# Patient Record
Sex: Female | Born: 1960 | Race: White | Hispanic: No | Marital: Married | Smoking: Never smoker
Health system: Southern US, Community
[De-identification: ages and names within clinical notes are randomized; demographics above are authoritative.]

## PROBLEM LIST (undated history)

## (undated) DIAGNOSIS — C539 Malignant neoplasm of cervix uteri, unspecified: Secondary | ICD-10-CM

## (undated) DIAGNOSIS — I10 Essential (primary) hypertension: Secondary | ICD-10-CM

## (undated) DIAGNOSIS — K602 Anal fissure, unspecified: Secondary | ICD-10-CM

## (undated) DIAGNOSIS — E079 Disorder of thyroid, unspecified: Secondary | ICD-10-CM

## (undated) DIAGNOSIS — D649 Anemia, unspecified: Secondary | ICD-10-CM

## (undated) DIAGNOSIS — C801 Malignant (primary) neoplasm, unspecified: Secondary | ICD-10-CM

## (undated) DIAGNOSIS — C4491 Basal cell carcinoma of skin, unspecified: Secondary | ICD-10-CM

## (undated) DIAGNOSIS — E039 Hypothyroidism, unspecified: Secondary | ICD-10-CM

## (undated) HISTORY — DX: Anal fissure, unspecified: K60.2

## (undated) HISTORY — DX: Anemia, unspecified: D64.9

## (undated) HISTORY — PX: GANGLION CYST EXCISION: SHX1691

## (undated) HISTORY — DX: Essential (primary) hypertension: I10

## (undated) HISTORY — PX: COLONOSCOPY: SHX174

## (undated) HISTORY — DX: Basal cell carcinoma of skin, unspecified: C44.91

## (undated) HISTORY — DX: Malignant neoplasm of cervix uteri, unspecified: C53.9

## (undated) HISTORY — DX: Disorder of thyroid, unspecified: E07.9

## (undated) HISTORY — DX: Hypothyroidism, unspecified: E03.9

## (undated) HISTORY — DX: Malignant (primary) neoplasm, unspecified: C80.1

---

## 1997-11-21 ENCOUNTER — Emergency Department (HOSPITAL_COMMUNITY): Admission: EM | Admit: 1997-11-21 | Discharge: 1997-11-21 | Payer: Self-pay | Admitting: Emergency Medicine

## 1998-04-04 ENCOUNTER — Other Ambulatory Visit: Admission: RE | Admit: 1998-04-04 | Discharge: 1998-04-04 | Payer: Self-pay | Admitting: Obstetrics and Gynecology

## 1999-05-05 ENCOUNTER — Other Ambulatory Visit: Admission: RE | Admit: 1999-05-05 | Discharge: 1999-05-05 | Payer: Self-pay | Admitting: Obstetrics and Gynecology

## 2000-05-20 ENCOUNTER — Other Ambulatory Visit: Admission: RE | Admit: 2000-05-20 | Discharge: 2000-05-20 | Payer: Self-pay | Admitting: Obstetrics and Gynecology

## 2001-09-17 ENCOUNTER — Other Ambulatory Visit: Admission: RE | Admit: 2001-09-17 | Discharge: 2001-09-17 | Payer: Self-pay | Admitting: Obstetrics and Gynecology

## 2002-10-22 ENCOUNTER — Other Ambulatory Visit: Admission: RE | Admit: 2002-10-22 | Discharge: 2002-10-22 | Payer: Self-pay | Admitting: Obstetrics and Gynecology

## 2004-02-18 ENCOUNTER — Other Ambulatory Visit: Admission: RE | Admit: 2004-02-18 | Discharge: 2004-02-18 | Payer: Self-pay | Admitting: Obstetrics and Gynecology

## 2005-04-10 ENCOUNTER — Other Ambulatory Visit: Admission: RE | Admit: 2005-04-10 | Discharge: 2005-04-10 | Payer: Self-pay | Admitting: Obstetrics and Gynecology

## 2005-04-30 ENCOUNTER — Ambulatory Visit (HOSPITAL_COMMUNITY): Admission: RE | Admit: 2005-04-30 | Discharge: 2005-04-30 | Payer: Self-pay | Admitting: Neurology

## 2008-10-19 ENCOUNTER — Ambulatory Visit (HOSPITAL_BASED_OUTPATIENT_CLINIC_OR_DEPARTMENT_OTHER): Admission: RE | Admit: 2008-10-19 | Discharge: 2008-10-19 | Payer: Self-pay | Admitting: Orthopedic Surgery

## 2009-06-16 ENCOUNTER — Ambulatory Visit: Payer: Self-pay | Admitting: Sports Medicine

## 2009-06-16 DIAGNOSIS — M25569 Pain in unspecified knee: Secondary | ICD-10-CM | POA: Insufficient documentation

## 2009-07-26 ENCOUNTER — Ambulatory Visit: Payer: Self-pay | Admitting: Sports Medicine

## 2009-07-26 DIAGNOSIS — M62838 Other muscle spasm: Secondary | ICD-10-CM | POA: Insufficient documentation

## 2010-01-05 ENCOUNTER — Encounter: Admission: RE | Admit: 2010-01-05 | Discharge: 2010-01-05 | Payer: Self-pay | Admitting: Obstetrics and Gynecology

## 2010-09-03 ENCOUNTER — Encounter: Payer: Self-pay | Admitting: Obstetrics and Gynecology

## 2010-11-23 LAB — POCT HEMOGLOBIN-HEMACUE: Hemoglobin: 13 g/dL (ref 12.0–15.0)

## 2010-12-26 NOTE — Op Note (Signed)
Holly, Love             ACCOUNT NO.:  [id number]   MEDICAL RECORD NO.:  [id number]          PATIENT TYPE:  AMB   LOCATION:  DSC                          FACILITY:  [facility]   PHYSICIAN:  Katy Fitch. Sypher, M.D. DATE OF BIRTH:  1960/11/29   DATE OF PROCEDURE:  10/19/2008  DATE OF DISCHARGE:                               OPERATIVE REPORT   PREOPERATIVE DIAGNOSIS:  Painful right dorsal ganglion.   POSTOPERATIVE DIAGNOSIS:  Painful right dorsal ganglion.   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 ganglion, with wrist arthrotomy and debridement of dorsal  aspect of scapholunate ligament.   OPERATING SURGEON:  Katy Fitch. Sypher, MD   ASSISTANT:  No assistant.   ANESTHESIA:  General by LMA.   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  I had  seen her several years prior and had diagnosed the dorsal ganglion.  She  elected to live with her situation as she knew was a benign process.  She returned in February of 2010 and reported increasing pain with wrist  motion and load bearing on the wrist and requested excision.   Once again, we reviewed the pathophysiology of ganglions.  She  understands that these are myxomatous degenerative lesions that are  poorly understood.  The cyst / mass could grow off the dorsal aspect of  the scapholunate ligament and present in the region of the second and  fourth dorsal compartment.  They can be mechanically debrided, but we  cannot guarantee that she will have resolution of this process with a  mechanical debridement of what is a biological misbehavior.   She was informed of the current art of removal.  She was brought to the  operating room at this time anticipating cyst debridement, arthrotomy,  scapholunate ligament debridement, and wound repair.   Questions were invited and answered in detail.   PROCEDURE:  Holly Love is brought to the operating room and placed  in a supine position on the operating table.   Following the induction of general anesthesia by LMA technique, the  right arm was prepped with Betadine soap and solution and sterilely  draped.  Following a time-out procedure and noting her PENICILLIN  allergy, her right arm was exsanguinated with an Esmarch bandage and an  arterial tourniquet inflated to 250 mmHg.  The procedure commenced with  a 2-cm transverse incision directly over the mass.  Subcutaneous tissues  were carefully divided.  The cyst was dissected past the extensor  retinaculum between the second and fourth dorsal compartments, around  the dorsal carpal arch and to the dorsal wrist capsular ligaments.  In  the line of the ligaments, an oblique incision was fashioned and the  neck of the cyst followed to the scapholunate ligament.  The joint was  explored.  A limited synovectomy was accomplished.  The scapholunate  ligament was yellowish and degenerative in appearance.  This was  debrided superficially with a micro rongeur.  Bleeding points were electrocauterized on the capsule.  The wound was  then inspected for other issues.  The capsule was then left open,  followed by wound closure with intradermal 3-0 Prolene.  A gauze pledget  was placed directly over the wound to close dead space followed by  sterile dressing with sterile gauze, sterile Webril, and an Ace wrap.  There were no apparent complications.   [name] may begin immediate wrist range of motion.  I will see her  back in followup in 1 week.  For aftercare, she is provided a  prescription for Vicodin 5 mg 1 or 2 tablets p.o. q.4-6 h. p.r.n. pain,  20 tablets without refill.      Katy Fitch Sypher, M.D.  Electronically Signed     RVS/MEDQ  D:  10/19/2008  T:  10/20/2008  Job:  [id number]   cc:   Marlou Starks

## 2012-07-17 ENCOUNTER — Ambulatory Visit: Payer: Self-pay | Admitting: Internal Medicine

## 2012-07-17 ENCOUNTER — Ambulatory Visit (INDEPENDENT_AMBULATORY_CARE_PROVIDER_SITE_OTHER): Payer: Managed Care, Other (non HMO) | Admitting: Internal Medicine

## 2012-07-17 ENCOUNTER — Encounter: Payer: Self-pay | Admitting: Internal Medicine

## 2012-07-17 VITALS — BP 150/88 | HR 73 | Temp 97.0°F | Resp 18 | Wt 157.0 lb

## 2012-07-17 DIAGNOSIS — R030 Elevated blood-pressure reading, without diagnosis of hypertension: Secondary | ICD-10-CM

## 2012-07-17 DIAGNOSIS — IMO0001 Reserved for inherently not codable concepts without codable children: Secondary | ICD-10-CM

## 2012-07-17 DIAGNOSIS — Z780 Asymptomatic menopausal state: Secondary | ICD-10-CM

## 2012-07-17 DIAGNOSIS — E039 Hypothyroidism, unspecified: Secondary | ICD-10-CM

## 2012-07-17 DIAGNOSIS — I10 Essential (primary) hypertension: Secondary | ICD-10-CM

## 2012-07-17 DIAGNOSIS — M8580 Other specified disorders of bone density and structure, unspecified site: Secondary | ICD-10-CM

## 2012-07-17 DIAGNOSIS — F419 Anxiety disorder, unspecified: Secondary | ICD-10-CM

## 2012-07-17 DIAGNOSIS — N951 Menopausal and female climacteric states: Secondary | ICD-10-CM

## 2012-07-17 DIAGNOSIS — M1990 Unspecified osteoarthritis, unspecified site: Secondary | ICD-10-CM

## 2012-07-17 DIAGNOSIS — M899 Disorder of bone, unspecified: Secondary | ICD-10-CM

## 2012-07-17 DIAGNOSIS — F411 Generalized anxiety disorder: Secondary | ICD-10-CM

## 2012-07-17 LAB — COMPREHENSIVE METABOLIC PANEL
ALT: 21 U/L (ref 0–35)
Alkaline Phosphatase: 64 U/L (ref 39–117)
CO2: 31 mEq/L (ref 19–32)
Chloride: 105 mEq/L (ref 96–112)
Creat: 0.84 mg/dL (ref 0.50–1.10)
Total Bilirubin: 0.4 mg/dL (ref 0.3–1.2)

## 2012-07-17 LAB — LIPID PANEL
HDL: 49 mg/dL (ref >39)
LDL Cholesterol: 144 mg/dL — ABNORMAL HIGH (ref 0–99)
Total CHOL/HDL Ratio: 4.7 Ratio
Triglycerides: 182 mg/dL — ABNORMAL HIGH (ref <150)

## 2012-07-17 LAB — CBC WITH DIFFERENTIAL/PLATELET
Hemoglobin: 12.6 g/dL (ref 12.0–15.0)
Monocytes Absolute: 0.5 10*3/uL (ref 0.1–1.0)
Monocytes Relative: 10 % (ref 3–12)
Neutrophils Relative %: 41 % — ABNORMAL LOW (ref 43–77)
Platelets: 260 10*3/uL (ref 150–400)
WBC: 5.2 10*3/uL (ref 4.0–10.5)

## 2012-07-17 NOTE — Progress Notes (Signed)
Subjective:    Patient ID: Holly Love, female    DOB: 01/21/1961, 51 y.o.   MRN: [id number]  HPI New pt here for first visit.    Former care Dr. Leane Platt and also sees [facility] Dr. Alessandra Bevels PMH of Hypothyroidism on Armour thryoid, anxiety, DJD of knees, perimenopause, migraine headaches and recent osteopenia of hips  Evey is concerned about taking armour thyroid and recent diagnosis of osteopenia.   She reports she was diagnosed by blood test and Dr. Alessandra Bevels placed her on armour thyroid.  At some point she was told to increase to 60 mg but pt began having heart palpitations so she went back to her current 30 mg dose.  She was told that the Armour thyroid would also help with memory issues.    Migraines controlled with Nsaids  Strong FH of HTN  both GP had CVA.  See BP  Pt asymptomatic no chest pain or pressure, no headache or dizziness.    Allergies  Allergen Reactions  . Penicillins Hypertension   History reviewed. No pertinent past medical history. Past Surgical History  Procedure Date  . Cesarean section    History   Social History  . Marital Status: Married    Spouse Name: N/A    Number of Children: N/A  . Years of Education: N/A   Occupational History  . Not on file.   Social History Main Topics  . Smoking status: Light Tobacco Smoker    Types: Cigarettes  . Smokeless tobacco: Never Used  . Alcohol Use:   . Drug Use: No  . Sexually Active: Yes   Other Topics Concern  . Not on file   Social History Narrative  . No narrative on file   Family History  Problem Relation Age of Onset  . Cancer Mother 35    breast  . Kidney disease Mother   . Hyperlipidemia Father    Patient Active Problem List  Diagnosis  . KNEE PAIN, BILATERAL  . MUSCLE SPASM, TRAPEZIUS   Current Outpatient Prescriptions on File Prior to Visit  Medication Sig Dispense Refill  . Calcium-Vitamin D-Vitamin K (CALCIUM + D + K PO) Take 1 tablet by mouth daily.       . progesterone (PROMETRIUM) 100 MG capsule Take 60 mg by mouth daily.      Marland Kitchen thyroid (ARMOUR) 30 MG tablet Take 30 mg by mouth daily.          Review of Systems See HPI    Objective:   Physical Exam Physical Exam  Nursing note and vitals reviewed.  Constitutional: She is oriented to person, place, and time. She appears well-developed and well-nourished.  HENT:  Head: Normocephalic and atraumatic.  Cardiovascular: Normal rate and regular rhythm. Exam reveals no gallop and no friction rub.  No murmur heard.  Pulmonary/Chest: Breath sounds normal. She has no wheezes. She has no rales.  Neurological: She is alert and oriented to person, place, and time.  Skin: Skin is warm and dry.  Psychiatric: She has a normal mood and affect. Her behavior is normal.  Ext no edema            Assessment & Plan:  Elevated BP:  Repeat  BP my exam still elevated.  Will recheck in 1-2 weeks.  Pt to moniter as an outpatient.  Will take off Armour thyroid for now and recheck thyroid levels when medication cleared . EKG today  SR no acute changes.  Question of hypothyroidism  Will need old records pt to sign.  Due to elevation of BP will stop Armour thyroid for now.  Check TSH today  DJD of knees  History of migraine  Continue NSAID  Concern for memory  Will readdress at next visit.    See me in 1-2 weeks or sooner prn

## 2012-07-20 ENCOUNTER — Encounter: Payer: Self-pay | Admitting: Internal Medicine

## 2012-07-20 DIAGNOSIS — N951 Menopausal and female climacteric states: Secondary | ICD-10-CM | POA: Insufficient documentation

## 2012-07-20 DIAGNOSIS — M8580 Other specified disorders of bone density and structure, unspecified site: Secondary | ICD-10-CM | POA: Insufficient documentation

## 2012-07-20 DIAGNOSIS — M159 Polyosteoarthritis, unspecified: Secondary | ICD-10-CM | POA: Insufficient documentation

## 2012-07-20 DIAGNOSIS — IMO0001 Reserved for inherently not codable concepts without codable children: Secondary | ICD-10-CM | POA: Insufficient documentation

## 2012-07-20 DIAGNOSIS — F411 Generalized anxiety disorder: Secondary | ICD-10-CM | POA: Insufficient documentation

## 2012-07-20 DIAGNOSIS — E039 Hypothyroidism, unspecified: Secondary | ICD-10-CM | POA: Insufficient documentation

## 2012-07-20 DIAGNOSIS — F419 Anxiety disorder, unspecified: Secondary | ICD-10-CM | POA: Insufficient documentation

## 2012-07-20 DIAGNOSIS — M1990 Unspecified osteoarthritis, unspecified site: Secondary | ICD-10-CM | POA: Insufficient documentation

## 2012-07-21 ENCOUNTER — Telehealth: Payer: Self-pay | Admitting: *Deleted

## 2012-07-21 ENCOUNTER — Encounter: Payer: Self-pay | Admitting: *Deleted

## 2012-07-21 NOTE — Telephone Encounter (Signed)
Lab results mailed to pt home address

## 2012-07-21 NOTE — Telephone Encounter (Signed)
Message copied by Mathews Robinsons on Mon Jul 21, 2012  1:49 PM ------      Message from: Raechel Chute D      Created: Sun Jul 20, 2012  3:39 PM       Ok to mail labs to pt.  Write on letter to keep appt with me

## 2012-07-31 ENCOUNTER — Ambulatory Visit (INDEPENDENT_AMBULATORY_CARE_PROVIDER_SITE_OTHER): Payer: Managed Care, Other (non HMO) | Admitting: Internal Medicine

## 2012-07-31 ENCOUNTER — Encounter: Payer: Self-pay | Admitting: Internal Medicine

## 2012-07-31 VITALS — BP 114/80 | HR 81 | Temp 97.3°F | Resp 16 | Wt 158.0 lb

## 2012-07-31 DIAGNOSIS — IMO0001 Reserved for inherently not codable concepts without codable children: Secondary | ICD-10-CM

## 2012-07-31 DIAGNOSIS — E785 Hyperlipidemia, unspecified: Secondary | ICD-10-CM

## 2012-07-31 DIAGNOSIS — M899 Disorder of bone, unspecified: Secondary | ICD-10-CM

## 2012-07-31 DIAGNOSIS — N951 Menopausal and female climacteric states: Secondary | ICD-10-CM

## 2012-07-31 DIAGNOSIS — F411 Generalized anxiety disorder: Secondary | ICD-10-CM

## 2012-07-31 DIAGNOSIS — M8580 Other specified disorders of bone density and structure, unspecified site: Secondary | ICD-10-CM

## 2012-07-31 DIAGNOSIS — F419 Anxiety disorder, unspecified: Secondary | ICD-10-CM

## 2012-07-31 DIAGNOSIS — R030 Elevated blood-pressure reading, without diagnosis of hypertension: Secondary | ICD-10-CM

## 2012-07-31 NOTE — Patient Instructions (Signed)
Keep next appt with me  [facility] for fish oil 2 gms daily

## 2012-07-31 NOTE — Progress Notes (Signed)
  Subjective:    Patient ID: Holly Love, female    DOB: Apr 12, 1961, 51 y.o.   MRN: [id number]  HPI Kandi is here after stopping her armour thyroid.  She has been checking her BP and SBP ranged 118-136 as outpt.  She states she feels less anxious  "calmer"  Allergies  Allergen Reactions  . Penicillins Hypertension   History reviewed. No pertinent past medical history. Past Surgical History  Procedure Date  . Cesarean section    History   Social History  . Marital Status: Married    Spouse Name: N/A    Number of Children: N/A  . Years of Education: N/A   Occupational History  . Not on file.   Social History Main Topics  . Smoking status: Light Tobacco Smoker    Types: Cigarettes  . Smokeless tobacco: Never Used  . Alcohol Use:   . Drug Use: No  . Sexually Active: Yes   Other Topics Concern  . Not on file   Social History Narrative  . No narrative on file   Family History  Problem Relation Age of Onset  . Cancer Mother 79    breast  . Kidney disease Mother   . Hyperlipidemia Father    Patient Active Problem List  Diagnosis  . KNEE PAIN, BILATERAL  . MUSCLE SPASM, TRAPEZIUS  . Elevated blood pressure  . Anxiety  . DJD (degenerative joint disease)  . Perimenopause  . Osteopenia  . Hypothyroidism   Current Outpatient Prescriptions on File Prior to Visit  Medication Sig Dispense Refill  . Calcium-Vitamin D-Vitamin K (CALCIUM + D + K PO) Take 2 tablets by mouth daily.       . cholecalciferol (VITAMIN D) 1000 UNITS tablet Take 1,000 Units by mouth daily.      . fish oil-omega-3 fatty acids 1000 MG capsule Take 2 g by mouth daily.      . magnesium 30 MG tablet Take 30 mg by mouth 2 (two) times daily.      . magnesium oxide (MAG-OX) 400 MG tablet Take 200 mg by mouth daily.       . NON FORMULARY 1 tablet daily. 5-HTP-CR hydroxytrophan      . OVER THE COUNTER MEDICATION 1 tablet daily. Vit A      . progesterone (PROMETRIUM) 100 MG capsule Take 60 mg by  mouth daily.           Review of Systems See HPI    Objective:   Physical Exam Physical Exam  Nursing note and vitals reviewed.  Constitutional: She is oriented to person, place, and time. She appears well-developed and well-nourished.  HENT:  Head: Normocephalic and atraumatic.  Cardiovascular: Normal rate and regular rhythm. Exam reveals no gallop and no friction rub.  No murmur heard.  Pulmonary/Chest: Breath sounds normal. She has no wheezes. She has no rales.  Neurological: She is alert and oriented to person, place, and time.  Skin: Skin is warm and dry.  Psychiatric: She has a normal mood and affect. Her behavior is normal.             Assessment & Plan:  Elevated BP  Normal now  Question of hypothyroid  Will check TSH and free levels at next appt  Hyperlipidemia  Levels were not fasting.  OK for fish oil and will recheck at next visit when fasting

## 2012-08-26 ENCOUNTER — Ambulatory Visit (INDEPENDENT_AMBULATORY_CARE_PROVIDER_SITE_OTHER): Payer: Managed Care, Other (non HMO) | Admitting: Internal Medicine

## 2012-08-26 ENCOUNTER — Encounter: Payer: Self-pay | Admitting: Internal Medicine

## 2012-08-26 VITALS — BP 109/66 | HR 69 | Temp 97.6°F | Resp 20 | Wt 157.0 lb

## 2012-08-26 DIAGNOSIS — K5289 Other specified noninfective gastroenteritis and colitis: Secondary | ICD-10-CM

## 2012-08-26 DIAGNOSIS — K529 Noninfective gastroenteritis and colitis, unspecified: Secondary | ICD-10-CM

## 2012-08-26 LAB — COMPREHENSIVE METABOLIC PANEL
ALT: 27 U/L (ref 0–35)
AST: 24 U/L (ref 0–37)
Alkaline Phosphatase: 67 U/L (ref 39–117)
BUN: 10 mg/dL (ref 6–23)
CO2: 27 mEq/L (ref 19–32)
Chloride: 106 mEq/L (ref 96–112)
Glucose, Bld: 78 mg/dL (ref 70–99)
Potassium: 4.1 mEq/L (ref 3.5–5.3)
Total Bilirubin: 0.4 mg/dL (ref 0.3–1.2)

## 2012-08-26 LAB — CBC WITH DIFFERENTIAL/PLATELET
Basophils Relative: 1 % (ref 0–1)
Eosinophils Relative: 3 % (ref 0–5)
Lymphocytes Relative: 32 % (ref 12–46)
Lymphs Abs: 1.5 10*3/uL (ref 0.7–4.0)
MCH: 31.3 pg (ref 26.0–34.0)
MCV: 89.8 fL (ref 78.0–100.0)
Neutro Abs: 2.5 10*3/uL (ref 1.7–7.7)
WBC: 4.5 10*3/uL (ref 4.0–10.5)

## 2012-08-26 MED ORDER — PROMETHAZINE HCL 25 MG PO TABS: 25.0000 mg | ORAL_TABLET | Freq: Three times a day (TID) | ORAL | Status: DC | PRN

## 2012-08-26 NOTE — Progress Notes (Signed)
  Subjective:    Patient ID: Holly Love, female    DOB: 10-Oct-1960, 52 y.o.   MRN: [id number]  HPI [name] is here for acute visit.  Allergies  Allergen Reactions  . Penicillins Hypertension   History reviewed. No pertinent past medical history. Past Surgical History  Procedure Date  . Cesarean section    History   Social History  . Marital Status: Married    Spouse Name: N/A    Number of Children: N/A  . Years of Education: N/A   Occupational History  . Not on file.   Social History Main Topics  . Smoking status: Light Tobacco Smoker    Types: Cigarettes  . Smokeless tobacco: Never Used  . Alcohol Use:   . Drug Use: No  . Sexually Active: Yes   Other Topics Concern  . Not on file   Social History Narrative  . No narrative on file   Family History  Problem Relation Age of Onset  . Cancer Mother 22    breast  . Kidney disease Mother   . Hyperlipidemia Father    Patient Active Problem List  Diagnosis  . KNEE PAIN, BILATERAL  . MUSCLE SPASM, TRAPEZIUS  . Elevated blood pressure  . Anxiety  . DJD (degenerative joint disease)  . Perimenopause  . Osteopenia  . Hypothyroidism  . Hyperlipidemia   Current Outpatient Prescriptions on File Prior to Visit  Medication Sig Dispense Refill  . Calcium-Vitamin D-Vitamin K (CALCIUM + D + K PO) Take 2 tablets by mouth daily.       . cholecalciferol (VITAMIN D) 1000 UNITS tablet Take 1,000 Units by mouth daily.      . fish oil-omega-3 fatty acids 1000 MG capsule Take 2 g by mouth daily.      . magnesium 30 MG tablet Take 30 mg by mouth 2 (two) times daily.      . NON FORMULARY 1 tablet daily. 5-HTP-CR hydroxytrophan      . OVER THE COUNTER MEDICATION 1 tablet daily. Vit A      . pyridOXINE (B-6) 50 MG tablet Take 50 mg by mouth daily.      . magnesium oxide (MAG-OX) 400 MG tablet Take 200 mg by mouth daily.       . progesterone (PROMETRIUM) 100 MG capsule Take 60 mg by mouth daily.           Review of  Systems see HPI     Objective:   Physical Exam Physical Exam  Nursing note and vitals reviewed.  Constitutional: She is oriented to person, place, and time. She appears well-developed and well-nourished.  HENT:  Head: Normocephalic and atraumatic.  Cardiovascular: Normal rate and regular rhythm. Exam reveals no gallop and no friction rub.  No murmur heard.  Pulmonary/Chest: Breath sounds normal. She has no wheezes. She has no rales. ABD:  Bowel sounds hyperactive.  NT?ND  No HSM.  No peritoneal signs.  No referred rbound  Neurological: She is alert and oriented to person, place, and time.  Skin: Skin is warm and dry.  Psychiatric: She has a normal mood and affect. Her behavior is normal.               Assessment & Plan:  Gastroenteritis  Likely viral  Advised bland diet  No dairy for 2 weeks.  Immodium for diarrhea  Nausea  Ok for Phenergan 25 mg p0  See me if no improvement

## 2012-08-26 NOTE — Patient Instructions (Addendum)
Over the counter Immodium  (chewable)

## 2012-08-28 ENCOUNTER — Encounter: Payer: Self-pay | Admitting: *Deleted

## 2012-09-01 ENCOUNTER — Telehealth: Payer: Self-pay | Admitting: *Deleted

## 2012-09-01 NOTE — Telephone Encounter (Signed)
Labs mailed to pt home address.

## 2012-09-18 ENCOUNTER — Encounter: Payer: Self-pay | Admitting: Internal Medicine

## 2012-09-18 ENCOUNTER — Ambulatory Visit (INDEPENDENT_AMBULATORY_CARE_PROVIDER_SITE_OTHER): Payer: Managed Care, Other (non HMO) | Admitting: Internal Medicine

## 2012-09-18 VITALS — BP 118/70 | HR 69 | Temp 97.7°F | Resp 16 | Ht 64.0 in | Wt 159.0 lb

## 2012-09-18 DIAGNOSIS — Z139 Encounter for screening, unspecified: Secondary | ICD-10-CM

## 2012-09-18 DIAGNOSIS — E039 Hypothyroidism, unspecified: Secondary | ICD-10-CM

## 2012-09-18 DIAGNOSIS — E785 Hyperlipidemia, unspecified: Secondary | ICD-10-CM

## 2012-09-18 LAB — T4, FREE: Free T4: 0.99 ng/dL (ref 0.80–1.80)

## 2012-09-18 LAB — TSH: TSH: 4.576 u[IU]/mL — ABNORMAL HIGH (ref 0.350–4.500)

## 2012-09-18 NOTE — Progress Notes (Signed)
Subjective:    Patient ID: Holly Love, female    DOB: [birth year], 52 y.o.   MRN: [id number]  HPI Alajia is here for follow up.  She wanted to come off her Armour Thyroid that was prescribed by Dr. Alessandra Bevels.  BP has been great.  She feels good .  She has noted she has gained 4 lbs at home  She has only gained 2 lbs by our scales  See lipid she has been watching low fat diet  Allergies  Allergen Reactions  . Penicillins Hypertension   No past medical history on file. Past Surgical History  Procedure Date  . Cesarean section    History   Social History  . Marital Status: Married    Spouse Name: N/A    Number of Children: N/A  . Years of Education: N/A   Occupational History  . Not on file.   Social History Main Topics  . Smoking status: Light Tobacco Smoker    Types: Cigarettes  . Smokeless tobacco: Never Used  . Alcohol Use:   . Drug Use: No  . Sexually Active: Yes   Other Topics Concern  . Not on file   Social History Narrative  . No narrative on file   Family History  Problem Relation Age of Onset  . Cancer Mother 50    breast  . Kidney disease Mother   . Hyperlipidemia Father    Patient Active Problem List  Diagnosis  . KNEE PAIN, BILATERAL  . MUSCLE SPASM, TRAPEZIUS  . Elevated blood pressure  . Anxiety  . DJD (degenerative joint disease)  . Perimenopause  . Osteopenia  . Hypothyroidism  . Hyperlipidemia  . Gastroenteritis   Current Outpatient Prescriptions on File Prior to Visit  Medication Sig Dispense Refill  . Calcium-Vitamin D-Vitamin K (CALCIUM + D + K PO) Take 2 tablets by mouth daily.       . cholecalciferol (VITAMIN D) 1000 UNITS tablet Take 1,000 Units by mouth daily.      . fish oil-omega-3 fatty acids 1000 MG capsule Take 2 g by mouth daily.      . magnesium 30 MG tablet Take 30 mg by mouth 2 (two) times daily.      . magnesium oxide (MAG-OX) 400 MG tablet Take 200 mg by mouth daily.       . NON FORMULARY 1 tablet daily.  5-HTP-CR hydroxytrophan      . OVER THE COUNTER MEDICATION 1 tablet daily. Vit A      . pyridOXINE (B-6) 50 MG tablet Take 50 mg by mouth daily.           Review of Systems    see hPI Objective:   Physical Exam  Physical Exam  Nursing note and vitals reviewed.  Constitutional: She is oriented to person, place, and time. She appears well-developed and well-nourished.  HENT:  Head: Normocephalic and atraumatic.  Cardiovascular: Normal rate and regular rhythm. Exam reveals no gallop and no friction rub.  No murmur heard.  Pulmonary/Chest: Breath sounds normal. She has no wheezes. She has no rales.  Neurological: She is alert and oriented to person, place, and time.  Skin: Skin is warm and dry.  Psychiatric: She has a normal mood and affect. Her behavior is normal.             Assessment & Plan:  Thyroid  Will check TSH with free levels today  Hyperlipidemia  Will check today  Elevated BP.  Normal measurements last  two  visits

## 2012-09-18 NOTE — Patient Instructions (Addendum)
See me as needed 

## 2012-09-22 ENCOUNTER — Encounter: Payer: Self-pay | Admitting: *Deleted

## 2012-09-22 ENCOUNTER — Telehealth: Payer: Self-pay | Admitting: Internal Medicine

## 2012-09-22 NOTE — Telephone Encounter (Signed)
Spoke with pt and informed of lab results . Advised to return in 6 months and will check TSH

## 2012-12-11 ENCOUNTER — Encounter: Payer: Self-pay | Admitting: Internal Medicine

## 2012-12-11 ENCOUNTER — Ambulatory Visit (INDEPENDENT_AMBULATORY_CARE_PROVIDER_SITE_OTHER): Payer: Managed Care, Other (non HMO) | Admitting: Internal Medicine

## 2012-12-11 VITALS — BP 138/86 | HR 64 | Temp 97.1°F | Resp 18 | Wt 159.0 lb

## 2012-12-11 DIAGNOSIS — N951 Menopausal and female climacteric states: Secondary | ICD-10-CM

## 2012-12-11 DIAGNOSIS — M899 Disorder of bone, unspecified: Secondary | ICD-10-CM

## 2012-12-11 DIAGNOSIS — T753XXA Motion sickness, initial encounter: Secondary | ICD-10-CM

## 2012-12-11 DIAGNOSIS — E038 Other specified hypothyroidism: Secondary | ICD-10-CM

## 2012-12-11 DIAGNOSIS — M8580 Other specified disorders of bone density and structure, unspecified site: Secondary | ICD-10-CM

## 2012-12-11 DIAGNOSIS — E039 Hypothyroidism, unspecified: Secondary | ICD-10-CM | POA: Insufficient documentation

## 2012-12-11 LAB — T4, FREE: Free T4: 1.07 ng/dL (ref 0.80–1.80)

## 2012-12-11 MED ORDER — SCOPOLAMINE 1 MG/3DAYS TD PT72: MEDICATED_PATCH | TRANSDERMAL | Status: DC

## 2012-12-11 NOTE — Progress Notes (Signed)
Subjective:    Patient ID: Holly Love, female    DOB: 1961/05/30, 52 y.o.   MRN: [id number]  HPI  Holly Love is here for follow up of several issues.  She has recently seen Dr. Turner Daniels for some low back discomfort and still being evaluated for this  She has noted since coming off her thyroid and HT she is having "memory issues"   She states it takes longer to find words.  FH of dementia in GF at advanced age in his 76s' otherwise FH is negative.    See labs.  TSH minimally elevated 2/24 with normal free levels.   No significant weight gain  No hair loss  No reports of dry skin  She also is having flushes day and night.  She is hesitant to start HT as she fears stroke risk.   She was formerly on progeterone creme with Dr. Alessandra Bevels .  She did have withdrawal bleeding in 09/2012 and 10/2012 off the progesterone  She tells me she has osteopenia  And is taking calcium and vitamin D  Recent pap and mammogram in February at Dr. Charlesetta Shanks office  She will be going on an [place] cruise in June and would like scopolamine patch as she has motion sickness.  She does not have glaucoma  Allergies  Allergen Reactions  . Penicillins Hypertension   Past Medical History  Diagnosis Date  . Thyroid disease    Past Surgical History  Procedure Laterality Date  . Cesarean section     History   Social History  . Marital Status: Married    Spouse Name: N/A    Number of Children: N/A  . Years of Education: N/A   Occupational History  . Not on file.   Social History Main Topics  . Smoking status: Light Tobacco Smoker    Types: Cigarettes  . Smokeless tobacco: Never Used  . Alcohol Use:   . Drug Use: No  . Sexually Active: Yes   Other Topics Concern  . Not on file   Social History Narrative  . No narrative on file   Family History  Problem Relation Age of Onset  . Cancer Mother 98    breast  . Kidney disease Mother   . Hyperlipidemia Father    Patient Active Problem List   Diagnosis  Date Noted  . Subclinical hypothyroidism 12/11/2012  . Gastroenteritis 08/26/2012  . Hyperlipidemia 07/31/2012  . Elevated blood pressure 07/20/2012  . Anxiety 07/20/2012  . DJD (degenerative joint disease) 07/20/2012  . Perimenopause 07/20/2012  . Osteopenia 07/20/2012  . Hypothyroidism 07/20/2012  . MUSCLE SPASM, TRAPEZIUS 07/26/2009  . KNEE PAIN, BILATERAL 06/16/2009   Current Outpatient Prescriptions on File Prior to Visit  Medication Sig Dispense Refill  . Calcium-Vitamin D-Vitamin K (CALCIUM + D + K PO) Take 2 tablets by mouth daily.       . cholecalciferol (VITAMIN D) 1000 UNITS tablet Take 1,000 Units by mouth daily.      . fish oil-omega-3 fatty acids 1000 MG capsule Take 2 g by mouth daily.      . magnesium 30 MG tablet Take 30 mg by mouth 2 (two) times daily.      . magnesium oxide (MAG-OX) 400 MG tablet Take 200 mg by mouth daily.       . NON FORMULARY 1 tablet daily. 5-HTP-CR hydroxytrophan      . OVER THE COUNTER MEDICATION 1 tablet daily. Vit A      . pyridOXINE (B-6) 50  MG tablet Take 50 mg by mouth daily.       No current facility-administered medications on file prior to visit.       Review of Systems See HPI    Objective:   Physical Exam Physical Exam  Nursing note and vitals reviewed.  Constitutional: She is oriented to person, place, and time. She appears well-developed and well-nourished.  HENT:  Head: Normocephalic and atraumatic.  Neck  NO thyromegaly  No distinct nodule  No lymphadenopathy Cardiovascular: Normal rate and regular rhythm. Exam reveals no gallop and no friction rub.  No murmur heard.  Pulmonary/Chest: Breath sounds normal. She has no wheezes. She has no rales.  Neurological: She is alert and oriented to person, place, and time.  Skin: Skin is warm and dry.  Psychiatric: She has a normal mood and affect. Her behavior is normal.             Assessment & Plan:  Perimenopausal hot flushes.  I again reviewed all the side  effects of HT  She is hesitant of stroke risk.  Advised OTC estroven   And cooling pillow for now  Subclinical hypothyroidism   Will check TSh and free levels today  Motion sickness OK for scopolomine skin patch  Change q 3days if needed

## 2012-12-11 NOTE — Patient Instructions (Addendum)
See me as needed  To have thyroid ultrasound

## 2012-12-16 ENCOUNTER — Encounter: Payer: Self-pay | Admitting: *Deleted

## 2012-12-16 ENCOUNTER — Emergency Department (HOSPITAL_BASED_OUTPATIENT_CLINIC_OR_DEPARTMENT_OTHER)
Admission: EM | Admit: 2012-12-16 | Discharge: 2012-12-17 | Disposition: A | Discharge status: Home / Self Care | Payer: Managed Care, Other (non HMO) | Attending: Emergency Medicine | Admitting: Emergency Medicine

## 2012-12-16 DIAGNOSIS — Z880 Allergy status to penicillin: Secondary | ICD-10-CM | POA: Insufficient documentation

## 2012-12-16 DIAGNOSIS — Z8639 Personal history of other endocrine, nutritional and metabolic disease: Secondary | ICD-10-CM | POA: Insufficient documentation

## 2012-12-16 DIAGNOSIS — F17200 Nicotine dependence, unspecified, uncomplicated: Secondary | ICD-10-CM | POA: Insufficient documentation

## 2012-12-16 DIAGNOSIS — Z862 Personal history of diseases of the blood and blood-forming organs and certain disorders involving the immune mechanism: Secondary | ICD-10-CM | POA: Insufficient documentation

## 2012-12-16 DIAGNOSIS — M549 Dorsalgia, unspecified: Secondary | ICD-10-CM

## 2012-12-16 DIAGNOSIS — I10 Essential (primary) hypertension: Secondary | ICD-10-CM

## 2012-12-16 DIAGNOSIS — Z79899 Other long term (current) drug therapy: Secondary | ICD-10-CM | POA: Insufficient documentation

## 2012-12-16 NOTE — ED Notes (Signed)
Blood pressure was elevated today. Started taking Meloxicam for back pain. 2 days she had jaw pain and the internet states call your Dr if you have jaw pain with Meloxicam. They told her to stop the medication.

## 2012-12-17 ENCOUNTER — Ambulatory Visit (HOSPITAL_BASED_OUTPATIENT_CLINIC_OR_DEPARTMENT_OTHER)
Admission: RE | Admit: 2012-12-17 | Discharge: 2012-12-17 | Disposition: A | Discharge status: Home / Self Care | Payer: Managed Care, Other (non HMO) | Source: Ambulatory Visit | Attending: Internal Medicine | Admitting: Internal Medicine

## 2012-12-17 ENCOUNTER — Encounter: Payer: Self-pay | Admitting: Internal Medicine

## 2012-12-17 ENCOUNTER — Ambulatory Visit (INDEPENDENT_AMBULATORY_CARE_PROVIDER_SITE_OTHER): Payer: Managed Care, Other (non HMO) | Admitting: Internal Medicine

## 2012-12-17 VITALS — BP 142/88 | HR 77 | Temp 97.2°F | Resp 18 | Wt 158.0 lb

## 2012-12-17 DIAGNOSIS — I10 Essential (primary) hypertension: Secondary | ICD-10-CM

## 2012-12-17 DIAGNOSIS — R5381 Other malaise: Secondary | ICD-10-CM | POA: Insufficient documentation

## 2012-12-17 DIAGNOSIS — E038 Other specified hypothyroidism: Secondary | ICD-10-CM

## 2012-12-17 DIAGNOSIS — E039 Hypothyroidism, unspecified: Secondary | ICD-10-CM | POA: Insufficient documentation

## 2012-12-17 DIAGNOSIS — E785 Hyperlipidemia, unspecified: Secondary | ICD-10-CM

## 2012-12-17 MED ORDER — TRAMADOL HCL 50 MG PO TABS: 50.0000 mg | ORAL_TABLET | Freq: Four times a day (QID) | ORAL | Status: DC | PRN

## 2012-12-17 MED ORDER — TRIAMTERENE-HCTZ 37.5-25 MG PO CAPS: 1.0000 | ORAL_CAPSULE | ORAL | Status: DC

## 2012-12-17 NOTE — Patient Instructions (Addendum)
See me in 3 weeks

## 2012-12-17 NOTE — ED Provider Notes (Signed)
History     CSN: [id number]  Arrival date & time 12/16/12  2201   First MD Initiated Contact with Patient 12/16/12 2353      Chief Complaint  Patient presents with  . Hypertension    (Consider location/radiation/quality/duration/timing/severity/associated sxs/prior treatment) Patient is a 52 y.o. female presenting with hypertension. The history is provided by the patient.  Hypertension   patient came in today with due to elevation of her blood pressure at home. Blood pressure home at systolic of 160. Denied any associated chest pain or shortness of breath. No nausea diaphoresis. Start taking an NSAID for her back pain recently. Days ago she did have 5 minutes of right-sided facial numbness with out associated anginal type symptoms. She is currently stopped taking the NSAID and presents at this time. Denies any change in her bowel or bladder function. She is able to ambulate normal  Past Medical History  Diagnosis Date  . Thyroid disease     Past Surgical History  Procedure Laterality Date  . Cesarean section      Family History  Problem Relation Age of Onset  . Cancer Mother 81    breast  . Kidney disease Mother   . Hyperlipidemia Father     History  Substance Use Topics  . Smoking status: Light Tobacco Smoker    Types: Cigarettes  . Smokeless tobacco: Never Used  . Alcohol Use:     OB History   Grav Para Term Preterm Abortions TAB SAB Ect Mult Living   2         2      Review of Systems  All other systems reviewed and are negative.    Allergies  Penicillins  Home Medications   Current Outpatient Rx  Name  Route  Sig  Dispense  Refill  . acetaminophen (TYLENOL) 500 MG tablet   Oral   Take 1,000 mg by mouth every 6 (six) hours as needed for pain.         . Calcium-Vitamin D-Vitamin K (CALCIUM + D + K PO)   Oral   Take 2 tablets by mouth daily.          . cholecalciferol (VITAMIN D) 1000 UNITS tablet   Oral   Take 1,000 Units by mouth  daily.         . fish oil-omega-3 fatty acids 1000 MG capsule   Oral   Take 2 g by mouth daily.         . magnesium 30 MG tablet   Oral   Take 30 mg by mouth 2 (two) times daily.         . magnesium oxide (MAG-OX) 400 MG tablet   Oral   Take 200 mg by mouth daily.          . MELOXICAM PO   Oral   Take 1 tablet by mouth daily.         . NON FORMULARY      1 tablet daily. 5-HTP-CR hydroxytrophan         . OVER THE COUNTER MEDICATION      1 tablet daily. Vit A         . pyridOXINE (B-6) 50 MG tablet   Oral   Take 50 mg by mouth daily.         Marland Kitchen scopolamine (TRANSDERM-SCOP) 1.5 MG      Place one patch behind ear 4 hours prior to boarding.   Change if needed every 3  days   4 patch   0     BP 142/83  Pulse 73  Temp(Src) 97.8 F (36.6 C) (Oral)  Resp 16  Wt 159 lb (72.122 kg)  BMI 27.28 kg/m2  SpO2 97%  LMP 11/01/2012  Physical Exam  Nursing note and vitals reviewed. Constitutional: She is oriented to person, place, and time. She appears well-developed and well-nourished.  Non-toxic appearance. No distress.  HENT:  Head: Normocephalic and atraumatic.  Eyes: Conjunctivae, EOM and lids are normal. Pupils are equal, round, and reactive to light.  Neck: Normal range of motion. Neck supple. No tracheal deviation present. No mass present.  Cardiovascular: Normal rate, regular rhythm and normal heart sounds.  Exam reveals no gallop.   No murmur heard. Pulmonary/Chest: Effort normal and breath sounds normal. No stridor. No respiratory distress. She has no decreased breath sounds. She has no wheezes. She has no rhonchi. She has no rales.  Abdominal: Soft. Normal appearance and bowel sounds are normal. She exhibits no distension. There is no tenderness. There is no rebound and no CVA tenderness.  Musculoskeletal: Normal range of motion. She exhibits no edema and no tenderness.  Neurological: She is alert and oriented to person, place, and time. She has  normal strength. No cranial nerve deficit or sensory deficit. GCS eye subscore is 4. GCS verbal subscore is 5. GCS motor subscore is 6.  Skin: Skin is warm and dry. No abrasion and no rash noted.  Psychiatric: She has a normal mood and affect. Her speech is normal and behavior is normal.    ED Course  Procedures (including critical care time)  Labs Reviewed - No data to display No results found.   No diagnosis found.    MDM    Date: 12/17/2012  Rate: 70  Rhythm: normal sinus rhythm  QRS Axis: normal  Intervals: normal  ST/T Wave abnormalities: normal  Conduction Disutrbances:none  Narrative Interpretation:   Old EKG Reviewed: none available  Patients repeat blood pressure here is stable. EKG is nonacute. No concern for ACS or neurological event. She'll follow with her Dr. for repeat blood pressure and was instructed to stop taking NSAIDs         Toy Baker, MD 12/17/12 [phone]

## 2012-12-17 NOTE — Progress Notes (Signed)
Subjective:    Patient ID: Holly Love, female    DOB: 1961/05/11, 52 y.o.   MRN: [id number]  HPI  Keri is here for ER follow up.  She had a BP of 160 measured at home associated with L arm numbness.    ER BP was 142/83 on recheck and EKG showed no acute changes.  She was sent home with not meds  Risk  both parents,  All siblings have HTN.  She does not smoke , she is not diabetic,  And lipid profile improved with diet changes.   Brother had MI in his 60's  BP this am with home moniter 135/94 and later was 147/82.  She has been intermittant generalized headaches.   No furhter numbness, no visual change,  No motor weakness .  No chesp pain or pressure  Allergies  Allergen Reactions  . Penicillins Hypertension   Past Medical History  Diagnosis Date  . Thyroid disease    Past Surgical History  Procedure Laterality Date  . Cesarean section     History   Social History  . Marital Status: Married    Spouse Name: N/A    Number of Children: N/A  . Years of Education: N/A   Occupational History  . Not on file.   Social History Main Topics  . Smoking status: Light Tobacco Smoker    Types: Cigarettes  . Smokeless tobacco: Never Used  . Alcohol Use:   . Drug Use: No  . Sexually Active: Yes   Other Topics Concern  . Not on file   Social History Narrative  . No narrative on file   Family History  Problem Relation Age of Onset  . Cancer Mother 60    breast  . Kidney disease Mother   . Hyperlipidemia Father    Patient Active Problem List   Diagnosis Date Noted  . Subclinical hypothyroidism 12/11/2012  . Gastroenteritis 08/26/2012  . Hyperlipidemia 07/31/2012  . Elevated blood pressure 07/20/2012  . Anxiety 07/20/2012  . DJD (degenerative joint disease) 07/20/2012  . Perimenopause 07/20/2012  . Osteopenia 07/20/2012  . Hypothyroidism 07/20/2012  . MUSCLE SPASM, TRAPEZIUS 07/26/2009  . KNEE PAIN, BILATERAL 06/16/2009   Current Outpatient Prescriptions on  File Prior to Visit  Medication Sig Dispense Refill  . Calcium-Vitamin D-Vitamin K (CALCIUM + D + K PO) Take 2 tablets by mouth daily.       . cholecalciferol (VITAMIN D) 1000 UNITS tablet Take 1,000 Units by mouth daily.      . fish oil-omega-3 fatty acids 1000 MG capsule Take 2 g by mouth daily.      . magnesium 30 MG tablet Take 30 mg by mouth 2 (two) times daily.      . MELOXICAM PO Take 1 tablet by mouth daily.      Marland Kitchen OVER THE COUNTER MEDICATION 1 tablet daily. Vit A      . pyridOXINE (B-6) 50 MG tablet Take 50 mg by mouth daily.      Marland Kitchen acetaminophen (TYLENOL) 500 MG tablet Take 1,000 mg by mouth every 6 (six) hours as needed for pain.      . magnesium oxide (MAG-OX) 400 MG tablet Take 200 mg by mouth daily.       . NON FORMULARY 1 tablet daily. 5-HTP-CR hydroxytrophan      . scopolamine (TRANSDERM-SCOP) 1.5 MG Place one patch behind ear 4 hours prior to boarding.   Change if needed every 3 days  4 patch  0  .  traMADol (ULTRAM) 50 MG tablet Take 1 tablet (50 mg total) by mouth every 6 (six) hours as needed for pain.  15 tablet  0   No current facility-administered medications on file prior to visit.      Review of Systems    see HPI Objective:   Physical Exam Physical Exam  Nursing note and vitals reviewed.  Constitutional: She is oriented to person, place, and time. She appears well-developed and well-nourished.  HENT:  Head: Normocephalic and atraumatic.  Cardiovascular: Normal rate and regular rhythm. Exam reveals no gallop and no friction rub.  No murmur heard.  Pulmonary/Chest: Breath sounds normal. She has no wheezes. She has no rales.  Neurological: She is alert and oriented to person, place, and time.  Skin: Skin is warm and dry.  Psychiatric: She has a normal mood and affect. Her behavior is normal.            Assessment & Plan:  HTN:  Will place on low dose maxide  Take daily see me in 3 weeks  Minimally elevated  TSH repeat is even lower with normal free  levesl  She has ulrasound pending  See me in 3 weeks

## 2012-12-18 ENCOUNTER — Telehealth: Payer: Self-pay | Admitting: *Deleted

## 2012-12-18 NOTE — Telephone Encounter (Signed)
Message copied by Mathews Robinsons on Thu Dec 18, 2012  5:16 PM ------      Message from: Raechel Chute D      Created: Thu Dec 18, 2012  1:50 PM       Kadance Mccuistion            Call pt and let her know that there are no nodules or masses in her thyroid.  Have her keep her follow up appt with me ------

## 2012-12-18 NOTE — Telephone Encounter (Signed)
Notified pt of - thyroid scan

## 2013-01-06 ENCOUNTER — Encounter: Payer: Self-pay | Admitting: Internal Medicine

## 2013-01-06 ENCOUNTER — Ambulatory Visit (INDEPENDENT_AMBULATORY_CARE_PROVIDER_SITE_OTHER): Payer: Managed Care, Other (non HMO) | Admitting: Internal Medicine

## 2013-01-06 VITALS — BP 105/68 | HR 86 | Temp 97.9°F | Resp 18 | Wt 157.0 lb

## 2013-01-06 DIAGNOSIS — R6889 Other general symptoms and signs: Secondary | ICD-10-CM

## 2013-01-06 DIAGNOSIS — Z803 Family history of malignant neoplasm of breast: Secondary | ICD-10-CM

## 2013-01-06 DIAGNOSIS — E039 Hypothyroidism, unspecified: Secondary | ICD-10-CM

## 2013-01-06 DIAGNOSIS — E038 Other specified hypothyroidism: Secondary | ICD-10-CM

## 2013-01-06 DIAGNOSIS — N951 Menopausal and female climacteric states: Secondary | ICD-10-CM | POA: Insufficient documentation

## 2013-01-06 MED ORDER — TRIAMTERENE-HCTZ 37.5-25 MG PO CAPS: 1.0000 | ORAL_CAPSULE | ORAL | Status: DC

## 2013-01-06 MED ORDER — PROGESTERONE MICRONIZED 100 MG PO CAPS: 100.0000 mg | ORAL_CAPSULE | Freq: Every day | ORAL | Status: DC

## 2013-01-06 MED ORDER — ESTRADIOL 0.05 MG/24HR TD PTTW: MEDICATED_PATCH | TRANSDERMAL | Status: DC

## 2013-01-06 NOTE — Patient Instructions (Addendum)
See me in 3 months  Take medicine as prescribed  Call office if any leg swelling or vaginal spotting after 4 months

## 2013-01-06 NOTE — Progress Notes (Signed)
Subjective:    Patient ID: Holly Love, female    DOB: January 17, 1961, 52 y.o.   MRN: [id number]  HPI Holly Love is here for follow up after initiating Maxide.   She is tolerating well and states she feels less "foggy"  No headaches  She does report increasing hot flushes,  3-4 per night interrupting sleep.  She has 1-2 during the day.  She would like to try HT again (she had been on compounded progesterone in the past) No personal or FH of DVT,PE.  MGM had CVA  PGM  CVA.  Mother diagnosed with breast Ca at age 57.  Pt's last mammogram was negative in 09/2012.  Holly Love is well aware of risk of breast CA with HT  She is very concerned about her memory.  Has trouble finding car,  Trouble with names,  Trouble with remembering tasks at work .   MMSE is 29/30 today.  Pt teary when discussing memory issues.  FH dementia in GF  Subclinical hypothyroidism  See ultrasound.  No nodule or masses.  She is asymptomatic except dry skin.  No FH of thyroid cancer,  No head/neck irradiation,  No trouble with swallowing  Allergies  Allergen Reactions  . Penicillins Hypertension   Past Medical History  Diagnosis Date  . Thyroid disease    Past Surgical History  Procedure Laterality Date  . Cesarean section     History   Social History  . Marital Status: Married    Spouse Name: N/A    Number of Children: N/A  . Years of Education: N/A   Occupational History  . Not on file.   Social History Main Topics  . Smoking status: Light Tobacco Smoker    Types: Cigarettes  . Smokeless tobacco: Never Used  . Alcohol Use:   . Drug Use: No  . Sexually Active: Yes   Other Topics Concern  . Not on file   Social History Narrative  . No narrative on file   Family History  Problem Relation Age of Onset  . Cancer Mother 36    breast  . Kidney disease Mother   . Hyperlipidemia Father    Patient Active Problem List   Diagnosis Date Noted  . Essential hypertension, benign 12/17/2012  . Subclinical  hypothyroidism 12/11/2012  . Gastroenteritis 08/26/2012  . Hyperlipidemia 07/31/2012  . Elevated blood pressure 07/20/2012  . Anxiety 07/20/2012  . DJD (degenerative joint disease) 07/20/2012  . Perimenopause 07/20/2012  . Osteopenia 07/20/2012  . Hypothyroidism 07/20/2012  . MUSCLE SPASM, TRAPEZIUS 07/26/2009  . KNEE PAIN, BILATERAL 06/16/2009   Current Outpatient Prescriptions on File Prior to Visit  Medication Sig Dispense Refill  . acetaminophen (TYLENOL) 500 MG tablet Take 1,000 mg by mouth every 6 (six) hours as needed for pain.      . Calcium-Vitamin D-Vitamin K (CALCIUM + D + K PO) Take 2 tablets by mouth daily.       . cholecalciferol (VITAMIN D) 1000 UNITS tablet Take 1,000 Units by mouth daily.      . fish oil-omega-3 fatty acids 1000 MG capsule Take 2 g by mouth daily.      . magnesium 30 MG tablet Take 30 mg by mouth 2 (two) times daily.      . magnesium oxide (MAG-OX) 400 MG tablet Take 200 mg by mouth daily.       . MELOXICAM PO Take 1 tablet by mouth daily.      Marland Kitchen OVER THE COUNTER MEDICATION 1 tablet  daily. Vit A      . pyridOXINE (B-6) 50 MG tablet Take 50 mg by mouth daily.      Marland Kitchen triamterene-hydrochlorothiazide (DYAZIDE) 37.5-25 MG per capsule Take 1 each (1 capsule total) by mouth every morning.  30 capsule  3  . scopolamine (TRANSDERM-SCOP) 1.5 MG Place one patch behind ear 4 hours prior to boarding.   Change if needed every 3 days  4 patch  0   No current facility-administered medications on file prior to visit.       Review of Systems See HPI    Objective:   Physical Exam Physical Exam  Nursing note and vitals reviewed.  Constitutional: She is oriented to person, place, and time. She appears well-developed and well-nourished.  HENT:  Head: Normocephalic and atraumatic.  Cardiovascular: Normal rate and regular rhythm. Exam reveals no gallop and no friction rub.  No murmur heard.  Pulmonary/Chest: Breath sounds normal. She has no wheezes. She has no  rales.  Neurological: She is alert and oriented to person, place, and time.  Skin: Skin is warm and dry.  Psychiatric: She has a normal mood and affect. Her behavior is normal.             Assessment & Plan:  HTN:  Well controlled on Maxide  Will continue.  Will check K next visit in 3 months  Perimenopausal hot flushes:  I reviewed risk/benefit of HT.  Holly Love is well aware of increased risk of breast cancer in light of first degree relative with cancer.  She feels her flushes are so much worse and would like to try a short course of HT.  Will give HT for 18-24 months only.  She signed risk/benefit sheet Start Minivelle .05 mg twice per week and prometrium 100 mg daily.  She is advised if any leg edema or vaginal spotting after 4 months to notify office immediately.  She voices understanding  Forgetfulness:  MMSE 29 today.  Pt very tearful and would like to speak to a neurologist about this.  Will set up referral.  Subclinical hypothyroidism:  NO compressive symptoms or risks for thyroid malignancy.  Discussed with pt and will follow  And recheck TSH  Twice a year.  Pt. Is in agreement  See me in 3 months

## 2013-01-07 ENCOUNTER — Encounter: Payer: Self-pay | Admitting: *Deleted

## 2013-04-06 ENCOUNTER — Ambulatory Visit (INDEPENDENT_AMBULATORY_CARE_PROVIDER_SITE_OTHER): Payer: Managed Care, Other (non HMO) | Admitting: Internal Medicine

## 2013-04-06 ENCOUNTER — Other Ambulatory Visit: Payer: Self-pay | Admitting: Internal Medicine

## 2013-04-06 ENCOUNTER — Encounter: Payer: Self-pay | Admitting: Internal Medicine

## 2013-04-06 VITALS — BP 128/74 | HR 72 | Temp 97.6°F | Resp 18 | Wt 159.0 lb

## 2013-04-06 DIAGNOSIS — N898 Other specified noninflammatory disorders of vagina: Secondary | ICD-10-CM

## 2013-04-06 DIAGNOSIS — I10 Essential (primary) hypertension: Secondary | ICD-10-CM

## 2013-04-06 DIAGNOSIS — E039 Hypothyroidism, unspecified: Secondary | ICD-10-CM

## 2013-04-06 DIAGNOSIS — N939 Abnormal uterine and vaginal bleeding, unspecified: Secondary | ICD-10-CM

## 2013-04-06 DIAGNOSIS — N951 Menopausal and female climacteric states: Secondary | ICD-10-CM

## 2013-04-06 DIAGNOSIS — E038 Other specified hypothyroidism: Secondary | ICD-10-CM

## 2013-04-06 LAB — TSH: TSH: 4.913 u[IU]/mL — ABNORMAL HIGH (ref 0.350–4.500)

## 2013-04-06 LAB — LIPID PANEL
Cholesterol: 193 mg/dL (ref 0–200)
HDL: 44 mg/dL (ref >39)
LDL Cholesterol: 105 mg/dL — ABNORMAL HIGH (ref 0–99)
Triglycerides: 219 mg/dL — ABNORMAL HIGH (ref <150)

## 2013-04-06 NOTE — Patient Instructions (Addendum)
Activate my chart

## 2013-04-06 NOTE — Progress Notes (Signed)
Subjective:    Patient ID: Holly Love, female    DOB: Mar 09, 1961, 52 y.o.   MRN: [id number]  HPI Holly Love is here to follow up on several clinical issues  HTN:  BP great on Maxide  Tolerating well.  Menopausal hot flushes on short course of HT.  She has some vaginal spotting since our last visit  Hyperlipidemia  See labs   Subclinical hypothyrodism  No compressive symptoms.  She reports she had some dizziness after her trip to [place].  She flew home and was on a boat for several days.  Does not feel like vertigo she has had in the past.  She tells me she is 75% improved since dizziness resolved  No visual changes no speech changes  Allergies  Allergen Reactions  . Penicillins Hypertension   Past Medical History  Diagnosis Date  . Thyroid disease    Past Surgical History  Procedure Laterality Date  . Cesarean section     History   Social History  . Marital Status: Married    Spouse Name: N/A    Number of Children: N/A  . Years of Education: N/A   Occupational History  . Not on file.   Social History Main Topics  . Smoking status: Light Tobacco Smoker    Types: Cigarettes  . Smokeless tobacco: Never Used  . Alcohol Use:   . Drug Use: No  . Sexual Activity: Yes   Other Topics Concern  . Not on file   Social History Narrative  . No narrative on file   Family History  Problem Relation Age of Onset  . Cancer Mother 37    breast  . Kidney disease Mother   . Hyperlipidemia Father    Patient Active Problem List   Diagnosis Date Noted  . Hot flushes, perimenopausal 01/06/2013  . FH: breast cancer in first degree relative 01/06/2013  . Forgetfulness 01/06/2013  . Essential hypertension, benign 12/17/2012  . Subclinical hypothyroidism 12/11/2012  . Gastroenteritis 08/26/2012  . Hyperlipidemia 07/31/2012  . Elevated blood pressure 07/20/2012  . Anxiety 07/20/2012  . DJD (degenerative joint disease) 07/20/2012  . Perimenopause 07/20/2012  . Osteopenia  07/20/2012  . Hypothyroidism 07/20/2012  . MUSCLE SPASM, TRAPEZIUS 07/26/2009  . KNEE PAIN, BILATERAL 06/16/2009   Current Outpatient Prescriptions on File Prior to Visit  Medication Sig Dispense Refill  . acetaminophen (TYLENOL) 500 MG tablet Take 1,000 mg by mouth every 6 (six) hours as needed for pain.      . Calcium-Vitamin D-Vitamin K (CALCIUM + D + K PO) Take 2 tablets by mouth daily.       . cholecalciferol (VITAMIN D) 1000 UNITS tablet Take 1,000 Units by mouth daily.      Marland Kitchen estradiol (MINIVELLE) 0.05 MG/24HR Place patch on skin and change twice per week  8 patch  3  . fish oil-omega-3 fatty acids 1000 MG capsule Take 2 g by mouth daily.      . magnesium 30 MG tablet Take 30 mg by mouth 2 (two) times daily.      . magnesium oxide (MAG-OX) 400 MG tablet Take 200 mg by mouth daily.       . MELOXICAM PO Take 1 tablet by mouth daily.      Marland Kitchen OVER THE COUNTER MEDICATION 1 tablet daily. Vit A      . progesterone (PROMETRIUM) 100 MG capsule Take 1 capsule (100 mg total) by mouth daily.  30 capsule  4  . pyridOXINE (B-6) 50 MG  tablet Take 50 mg by mouth daily.      Marland Kitchen triamterene-hydrochlorothiazide (DYAZIDE) 37.5-25 MG per capsule Take 1 each (1 capsule total) by mouth every morning.  30 capsule  3  . scopolamine (TRANSDERM-SCOP) 1.5 MG Place one patch behind ear 4 hours prior to boarding.   Change if needed every 3 days  4 patch  0   No current facility-administered medications on file prior to visit.       Review of Systems See HPI    Objective:   Physical Exam Physical Exam  Nursing note and vitals reviewed.  Constitutional: She is oriented to person, place, and time. She appears well-developed and well-nourished.  HENT:  Head: Normocephalic and atraumatic.  Cardiovascular: Normal rate and regular rhythm. Exam reveals no gallop and no friction rub.  No murmur heard.  Pulmonary/Chest: Breath sounds normal. She has no wheezes. She has no rales.  Neurological: She is alert and  oriented to person, place, and time.  CNII-XII intact Motor  5/5 uE and LE Reflexes 2+ symmetric Sensory intact to pp Skin: Skin is warm and dry.  Psychiatric: She has a normal mood and affect. Her behavior is normal.         Assessment & Plan:  Vaginal spotting  Will refer back to Dr. Marcelle Overlie.  Counseled pt.  That she will need ultrasound and further evalutaion  Dizziness  Nonfocal exam.   If continues will consider imaging.  Pt is to call  Perimenopausal flushes  Well controlled with HT  Htn  Continue maxide    Hyperlipidemia  Will recheck today  Subclinical hypothyroidism  Check labs today

## 2013-04-06 NOTE — Telephone Encounter (Signed)
Refill request

## 2013-04-09 ENCOUNTER — Encounter: Payer: Self-pay | Admitting: *Deleted

## 2013-05-12 ENCOUNTER — Other Ambulatory Visit: Payer: Self-pay | Admitting: Internal Medicine

## 2013-05-12 NOTE — Telephone Encounter (Signed)
Refill request

## 2013-05-14 ENCOUNTER — Other Ambulatory Visit: Payer: Self-pay | Admitting: Internal Medicine

## 2013-05-14 NOTE — Telephone Encounter (Signed)
Refill request

## 2013-05-18 ENCOUNTER — Other Ambulatory Visit: Payer: Self-pay | Admitting: *Deleted

## 2013-05-18 MED ORDER — ESTRADIOL 0.05 MG/24HR TD PTTW: MEDICATED_PATCH | TRANSDERMAL | Status: DC

## 2013-05-18 NOTE — Telephone Encounter (Signed)
Send as refill request   

## 2013-05-18 NOTE — Telephone Encounter (Signed)
refill 

## 2013-05-18 NOTE — Telephone Encounter (Signed)
Refill request last MM 5/14 normal

## 2013-05-18 NOTE — Telephone Encounter (Signed)
[facility] called on behalf of Holly Love to get a refill of her Minivelle patch

## 2013-05-20 ENCOUNTER — Other Ambulatory Visit: Payer: Self-pay | Admitting: Internal Medicine

## 2013-05-21 NOTE — Telephone Encounter (Signed)
Refill request Last MM 2/14

## 2013-06-15 ENCOUNTER — Other Ambulatory Visit: Payer: Self-pay | Admitting: Internal Medicine

## 2013-06-15 NOTE — Telephone Encounter (Signed)
Refill request UTD on MM 

## 2013-06-16 ENCOUNTER — Ambulatory Visit (INDEPENDENT_AMBULATORY_CARE_PROVIDER_SITE_OTHER): Payer: Managed Care, Other (non HMO) | Admitting: Internal Medicine

## 2013-06-16 ENCOUNTER — Encounter: Payer: Self-pay | Admitting: Internal Medicine

## 2013-06-16 VITALS — BP 127/86 | HR 81 | Temp 98.2°F | Resp 16 | Wt 160.0 lb

## 2013-06-16 DIAGNOSIS — R7989 Other specified abnormal findings of blood chemistry: Secondary | ICD-10-CM | POA: Insufficient documentation

## 2013-06-16 DIAGNOSIS — R279 Unspecified lack of coordination: Secondary | ICD-10-CM

## 2013-06-16 DIAGNOSIS — R42 Dizziness and giddiness: Secondary | ICD-10-CM

## 2013-06-16 DIAGNOSIS — R946 Abnormal results of thyroid function studies: Secondary | ICD-10-CM

## 2013-06-16 DIAGNOSIS — R270 Ataxia, unspecified: Secondary | ICD-10-CM

## 2013-06-16 NOTE — Progress Notes (Addendum)
Subjective:    Patient ID: Holly Love, female    DOB: 01/16/1961, 52 y.o.   MRN: [id number]  HPI Holly Love is here for acute visit.    She has been having dizziness since June of this year.  She has been taking her maxide faithfully but still describes a feeling of loss of balance  No visual , speech changes.  No parethesias , numbness or muscle weakness.   Symptoms have been associated with a change in smell  Additionally she has been seeing Dr. Turner Daniels for her sciatica.  She had an MRI but I do not have results.  She would like to have the opinion of a medical neurologist.    Allergies  Allergen Reactions  . Penicillins Hypertension   Past Medical History  Diagnosis Date  . Thyroid disease    Past Surgical History  Procedure Laterality Date  . Cesarean section     History   Social History  . Marital Status: Married    Spouse Name: N/A    Number of Children: N/A  . Years of Education: N/A   Occupational History  . Not on file.   Social History Main Topics  . Smoking status: Light Tobacco Smoker    Types: Cigarettes  . Smokeless tobacco: Never Used  . Alcohol Use:   . Drug Use: No  . Sexual Activity: Yes   Other Topics Concern  . Not on file   Social History Narrative  . No narrative on file   Family History  Problem Relation Age of Onset  . Cancer Mother 89    breast  . Kidney disease Mother   . Hyperlipidemia Father    Patient Active Problem List   Diagnosis Date Noted  . Elevated TSH 06/16/2013  . Hot flushes, perimenopausal 01/06/2013  . FH: breast cancer in first degree relative 01/06/2013  . Forgetfulness 01/06/2013  . Essential hypertension, benign 12/17/2012  . Subclinical hypothyroidism 12/11/2012  . Gastroenteritis 08/26/2012  . Hyperlipidemia 07/31/2012  . Elevated blood pressure 07/20/2012  . Anxiety 07/20/2012  . DJD (degenerative joint disease) 07/20/2012  . Perimenopause 07/20/2012  . Osteopenia 07/20/2012  . Hypothyroidism  07/20/2012  . MUSCLE SPASM, TRAPEZIUS 07/26/2009  . KNEE PAIN, BILATERAL 06/16/2009   Current Outpatient Prescriptions on File Prior to Visit  Medication Sig Dispense Refill  . acetaminophen (TYLENOL) 500 MG tablet Take 1,000 mg by mouth every 6 (six) hours as needed for pain.      . Calcium-Vitamin D-Vitamin K (CALCIUM + D + K PO) Take 2 tablets by mouth daily.       . cholecalciferol (VITAMIN D) 1000 UNITS tablet Take 1,000 Units by mouth daily.      Marland Kitchen estradiol (MINIVELLE) 0.05 MG/24HR patch Place patch on skin and change twice per week  8 patch  3  . fish oil-omega-3 fatty acids 1000 MG capsule Take 2 g by mouth daily.      . magnesium 30 MG tablet Take 30 mg by mouth 2 (two) times daily.      . magnesium oxide (MAG-OX) 400 MG tablet Take 200 mg by mouth daily.       . MELOXICAM PO Take 1 tablet by mouth daily.      Marland Kitchen MINIVELLE 0.05 MG/24HR patch PLACE PATCH ON SKIN AND CHANGE TWICE PER WEEK  8 patch  3  . OVER THE COUNTER MEDICATION 1 tablet daily. Vit A      . progesterone (PROMETRIUM) 100 MG capsule TAKE 1 CAPSULE (  100 MG TOTAL) BY MOUTH DAILY.  30 capsule  4  . pyridOXINE (B-6) 50 MG tablet Take 50 mg by mouth daily.      Marland Kitchen triamterene-hydrochlorothiazide (MAXZIDE-25) 37.5-25 MG per tablet TAKE 1 TABLET BY MOUTH EVERY MORNING  30 tablet  3  . scopolamine (TRANSDERM-SCOP) 1.5 MG Place one patch behind ear 4 hours prior to boarding.   Change if needed every 3 days  4 patch  0   No current facility-administered medications on file prior to visit.       Review of Systems    see HPI Objective:   Physical Exam Physical Exam  Nursing note and vitals reviewed.   See VS  She is not orthostatic Constitutional: She is oriented to person, place, and time. She appears well-developed and well-nourished.  HENT:  Head: Normocephalic and atraumatic.  Cardiovascular: Normal rate and regular rhythm. Exam reveals no gallop and no friction rub.  No murmur heard.  Pulmonary/Chest: Breath  sounds normal. She has no wheezes. She has no rales.  Neurological: She is alert and oriented to person, place, and time.  CNII-XII intact Motor  5/5 all groups tested Sensory intact to prinprick Cerebellar normal FTN Reflexes 2+ symmetric Skin: Skin is warm and dry.  Psychiatric: She has a normal mood and affect. Her behavior is normal.              Assessment & Plan:  Dizziness/ataxia/  Change in smell:  Etiology unclear at this point.  Will get head CT .  Neurologic exam nonfocal.  She is not orthostatic on exam  Sciatica  Low back discomfort.  She had MRI per her orthopedic and pt reports "no nerve pinching" was found  Will refer to neurology  She is to follow up with me in 2-3 weeks  Addendum 11/6/  Normal unenhanced CT scan of brain  [facility] see scanned report

## 2013-06-16 NOTE — Patient Instructions (Addendum)
Will set up appointment with [facility] neurology  Dr. Everlena Cooper or Dr. Allena Katz  Will set up CT  Scan at our facility  See me in 2-3 weeks

## 2013-06-18 ENCOUNTER — Other Ambulatory Visit: Payer: Self-pay

## 2013-06-18 ENCOUNTER — Telehealth: Payer: Self-pay | Admitting: Internal Medicine

## 2013-06-18 ENCOUNTER — Encounter: Payer: Self-pay | Admitting: *Deleted

## 2013-06-18 ENCOUNTER — Telehealth: Payer: Self-pay | Admitting: *Deleted

## 2013-06-18 NOTE — Telephone Encounter (Signed)
Notified pt of - CT results LVM message

## 2013-06-18 NOTE — Telephone Encounter (Signed)
Holly Love  Call pt and let her know that her brain CT is normal  . Advise her to keep follow up appt with me

## 2013-06-19 ENCOUNTER — Encounter: Payer: Self-pay | Admitting: *Deleted

## 2013-07-01 ENCOUNTER — Encounter: Payer: Self-pay | Admitting: Internal Medicine

## 2013-07-01 ENCOUNTER — Ambulatory Visit (INDEPENDENT_AMBULATORY_CARE_PROVIDER_SITE_OTHER): Payer: Managed Care, Other (non HMO) | Admitting: Internal Medicine

## 2013-07-01 VITALS — BP 114/73 | HR 98 | Temp 98.3°F | Resp 18 | Wt 163.0 lb

## 2013-07-01 DIAGNOSIS — F419 Anxiety disorder, unspecified: Secondary | ICD-10-CM

## 2013-07-01 DIAGNOSIS — I10 Essential (primary) hypertension: Secondary | ICD-10-CM

## 2013-07-01 DIAGNOSIS — F411 Generalized anxiety disorder: Secondary | ICD-10-CM

## 2013-07-01 LAB — BASIC METABOLIC PANEL
BUN: 10 mg/dL (ref 6–23)
CO2: 28 mEq/L (ref 19–32)
Chloride: 102 mEq/L (ref 96–112)
Creat: 0.74 mg/dL (ref 0.50–1.10)
Glucose, Bld: 82 mg/dL (ref 70–99)
Sodium: 136 mEq/L (ref 135–145)

## 2013-07-01 NOTE — Patient Instructions (Signed)
See me as needed 

## 2013-07-01 NOTE — Progress Notes (Signed)
Subjective:    Patient ID: Holly Love, female    DOB: Dec 11, 1960, 52 y.o.   MRN: [id number]  HPI  Holly Love is here for follow up of her balance issues.  She states feeling of "off balance" is less and she is  Wondering if it is related to her gait or her back.    Head CT unremarkable  She has upcoming appt with neurologist    Tolerating Maxide well and her BP is great  Allergies  Allergen Reactions  . Penicillins Hypertension   Past Medical History  Diagnosis Date  . Thyroid disease    Past Surgical History  Procedure Laterality Date  . Cesarean section     History   Social History  . Marital Status: Married    Spouse Name: N/A    Number of Children: N/A  . Years of Education: N/A   Occupational History  . Not on file.   Social History Main Topics  . Smoking status: Light Tobacco Smoker    Types: Cigarettes  . Smokeless tobacco: Never Used  . Alcohol Use:   . Drug Use: No  . Sexual Activity: Yes   Other Topics Concern  . Not on file   Social History Narrative  . No narrative on file   Family History  Problem Relation Age of Onset  . Cancer Mother 107    breast  . Kidney disease Mother   . Hyperlipidemia Father    Patient Active Problem List   Diagnosis Date Noted  . Elevated TSH 06/16/2013  . Hot flushes, perimenopausal 01/06/2013  . FH: breast cancer in first degree relative 01/06/2013  . Forgetfulness 01/06/2013  . Essential hypertension, benign 12/17/2012  . Subclinical hypothyroidism 12/11/2012  . Gastroenteritis 08/26/2012  . Hyperlipidemia 07/31/2012  . Elevated blood pressure 07/20/2012  . Anxiety 07/20/2012  . DJD (degenerative joint disease) 07/20/2012  . Perimenopause 07/20/2012  . Osteopenia 07/20/2012  . Hypothyroidism 07/20/2012  . MUSCLE SPASM, TRAPEZIUS 07/26/2009  . KNEE PAIN, BILATERAL 06/16/2009   Current Outpatient Prescriptions on File Prior to Visit  Medication Sig Dispense Refill  . acetaminophen (TYLENOL) 500  MG tablet Take 1,000 mg by mouth every 6 (six) hours as needed for pain.      . Calcium-Vitamin D-Vitamin K (CALCIUM + D + K PO) Take 2 tablets by mouth daily.       . cholecalciferol (VITAMIN D) 1000 UNITS tablet Take 1,000 Units by mouth daily.      Marland Kitchen estradiol (MINIVELLE) 0.05 MG/24HR patch Place patch on skin and change twice per week  8 patch  3  . fish oil-omega-3 fatty acids 1000 MG capsule Take 2 g by mouth daily.      . magnesium 30 MG tablet Take 30 mg by mouth 2 (two) times daily.      . magnesium oxide (MAG-OX) 400 MG tablet Take 200 mg by mouth daily.       . MELOXICAM PO Take 1 tablet by mouth daily.      Marland Kitchen OVER THE COUNTER MEDICATION 1 tablet daily. Vit A      . progesterone (PROMETRIUM) 100 MG capsule TAKE 1 CAPSULE (100 MG TOTAL) BY MOUTH DAILY.  30 capsule  4  . pyridOXINE (B-6) 50 MG tablet Take 50 mg by mouth daily.      Marland Kitchen scopolamine (TRANSDERM-SCOP) 1.5 MG Place one patch behind ear 4 hours prior to boarding.   Change if needed every 3 days  4 patch  0  .  triamterene-hydrochlorothiazide (MAXZIDE-25) 37.5-25 MG per tablet TAKE 1 TABLET BY MOUTH EVERY MORNING  30 tablet  3   No current facility-administered medications on file prior to visit.      Review of Systems See HPI    Objective:   Physical Exam Physical Exam  Nursing note and vitals reviewed.  Constitutional: She is oriented to person, place, and time. She appears well-developed and well-nourished.  HENT:  Head: Normocephalic and atraumatic.  Cardiovascular: Normal rate and regular rhythm. Exam reveals no gallop and no friction rub.  No murmur heard.  Pulmonary/Chest: Breath sounds normal. She has no wheezes. She has no rales.  Neurological: She is alert and oriented to person, place, and time.  Skin: Skin is warm and dry.  Psychiatric: She has a normal mood and affect. Her behavior is normal.         Assessment & Plan:    Subjective dizziness  Ct neg  She has upcoming appt with neurologist     htn  On maxide will check BMP today  See me as needed

## 2013-07-03 ENCOUNTER — Ambulatory Visit (INDEPENDENT_AMBULATORY_CARE_PROVIDER_SITE_OTHER): Payer: Managed Care, Other (non HMO) | Admitting: Neurology

## 2013-07-03 ENCOUNTER — Encounter: Payer: Self-pay | Admitting: Neurology

## 2013-07-03 VITALS — BP 115/70 | HR 76 | Temp 98.0°F | Ht 64.0 in | Wt 162.2 lb

## 2013-07-03 DIAGNOSIS — M545 Low back pain, unspecified: Secondary | ICD-10-CM

## 2013-07-03 DIAGNOSIS — R209 Unspecified disturbances of skin sensation: Secondary | ICD-10-CM

## 2013-07-03 DIAGNOSIS — M5410 Radiculopathy, site unspecified: Secondary | ICD-10-CM

## 2013-07-03 DIAGNOSIS — IMO0002 Reserved for concepts with insufficient information to code with codable children: Secondary | ICD-10-CM

## 2013-07-03 MED ORDER — GABAPENTIN 300 MG PO CAPS: ORAL_CAPSULE | ORAL | Status: DC

## 2013-07-03 MED ORDER — CYCLOBENZAPRINE HCL 5 MG PO TABS: 5.0000 mg | ORAL_TABLET | Freq: Three times a day (TID) | ORAL | Status: DC | PRN

## 2013-07-03 NOTE — Patient Instructions (Signed)
1.  Start taking neurontin 300mg  at bedtime for 3 days, if tolerating; increase to one tablet twice daily 2.  Continue taking flexiril 5mg  three times daily as needed 3.  Continue physical therapy 4.  Return to clinic in 38-months

## 2013-07-03 NOTE — Progress Notes (Signed)
[facility] Clinic Note - Initial Visit   Date: 07/03/2013    VERLEAN ALLPORT MRN: [id number] DOB: [birth year]   Dear Dr Constance Goltz:  Thank you for your kind referral of LORYN HAACKE for consultation of low back pain. Although her history is well known to you, please allow [place] to reiterate it for the purpose of our medical record. The patient was accompanied to the clinic by self.   History of Present Illness: FLORANCE PAOLILLO is a 52 y.o. year-old right-handed Caucasian female with history of low back pain presenting for evaluation of shooting leg pain and paresthesias.    In May 2013, she fell and since then she had developed slowly progressive low back pain.  In April 2014, she developed shooting pain starting from her buttocks and radiating into her legs, worse on the right. Symptoms are better when she is standing, walking, or laying, but prolonged sitting and standing exacerbates her symptoms.  She has associated tingling of the legs.  Denies any bowel/bladder problems.    She saw Dr. Elita Quick who recommended physical therapy and NSAIDs. She does feel better with therapy for brief time, which resolved her shooting pain, but she continues to have prickly sensation from her buttocks to her posterior thigh down to her knees.   She had a MRI of the lumbar spine which did not show any specific nerve impingement.  She started seeing integrative therapy which also helps, but does not resolve her pain.  She had tried heat, ice, and muscle relaxants.  Of note, she had seen Dr. Sandria Manly in 2006 for chronic daily headaches.    Out-side paper records, electronic medical record, and images have been reviewed where available and summarized as:  MRA brain 04/30/2005: negative MRI of the brain 04/30/2005:  unremarkable  MRI lumbar spine 02/10/2013:  Grade I anterolisthesis at L4-5 with moderate facet hypertrophy and some uncovering of the disc.  No focal stenosis is evident  in the supine position   Past Medical History  Diagnosis Date  . Thyroid disease     Previously on medications, now off    Past Surgical History  Procedure Laterality Date  . Cesarean section       Medications:  Current Outpatient Prescriptions on File Prior to Visit  Medication Sig Dispense Refill  . acetaminophen (TYLENOL) 500 MG tablet Take 1,000 mg by mouth every 6 (six) hours as needed for pain.      . Calcium-Vitamin D-Vitamin K (CALCIUM + D + K PO) Take 2 tablets by mouth daily.       . cholecalciferol (VITAMIN D) 1000 UNITS tablet Take 1,000 Units by mouth daily.      Marland Kitchen estradiol (MINIVELLE) 0.05 MG/24HR patch Place patch on skin and change twice per week  8 patch  3  . fish oil-omega-3 fatty acids 1000 MG capsule Take 2 g by mouth daily.      . magnesium 30 MG tablet Take 30 mg by mouth 2 (two) times daily.      . magnesium oxide (MAG-OX) 400 MG tablet Take 200 mg by mouth daily.       . MELOXICAM PO Take 1 tablet by mouth daily.      Marland Kitchen OVER THE COUNTER MEDICATION 1 tablet daily. Vit A      . progesterone (PROMETRIUM) 100 MG capsule TAKE 1 CAPSULE (100 MG TOTAL) BY MOUTH DAILY.  30 capsule  4  . pyridOXINE (B-6) 50 MG tablet Take 50 mg  by mouth daily.      Marland Kitchen scopolamine (TRANSDERM-SCOP) 1.5 MG Place one patch behind ear 4 hours prior to boarding.   Change if needed every 3 days  4 patch  0  . triamterene-hydrochlorothiazide (MAXZIDE-25) 37.5-25 MG per tablet TAKE 1 TABLET BY MOUTH EVERY MORNING  30 tablet  3   No current facility-administered medications on file prior to visit.    Allergies:  Allergies  Allergen Reactions  . Penicillins Hypertension    Family History: Family History  Problem Relation Age of Onset  . Cancer Mother 38    breast  . Kidney disease Mother   . Hyperlipidemia Father     Social History: History   Social History  . Marital Status: Married    Spouse Name: N/A    Number of Children: N/A  . Years of Education: N/A    Occupational History  . Not on file.   Social History Main Topics  . Smoking status: Never Smoker   . Smokeless tobacco: Never Used  . Alcohol Use: 0.0 oz/week    1-2 Glasses of wine per week     Comment: Drinks wine about 5 glasses per week  . Drug Use: No  . Sexual Activity: Yes   Other Topics Concern  . Not on file   Social History Narrative   She works as a Firefighter   She lives with husband.  They have two children.    Review of Systems:  CONSTITUTIONAL: No fevers, chills, night sweats, or weight loss.   EYES: No visual changes or eye pain ENT: No hearing changes.  No history of nose bleeds.   RESPIRATORY: No cough, wheezing and shortness of breath.   CARDIOVASCULAR: Negative for chest pain, and palpitations.   GI: Negative for abdominal discomfort, blood in stools or black stools.  No recent change in bowel habits.   GU:  No history of incontinence.   MUSCLOSKELETAL: No history of joint pain or swelling.  No myalgias.   SKIN: Negative for lesions, rash, and itching.   HEMATOLOGY/ONCOLOGY: Negative for prolonged bleeding, bruising easily, and swollen nodes.   ENDOCRINE: Negative for cold or heat intolerance, polydipsia or goiter.   PSYCH:  No depression or anxiety symptoms.   NEURO: As Above.   Vital Signs:  BP 115/70  Pulse 76  Temp(Src) 98 F (36.7 C) (Oral)  Ht 5\' 4"  (1.626 m)  Wt 162 lb 3.2 oz (73.573 kg)  BMI 27.83 kg/m2  LMP 06/03/2013  Neurological Exam: MENTAL STATUS including orientation to time, place, person, recent and remote memory, attention span and concentration, language, and fund of knowledge is normal.  Speech is not dysarthric.  CRANIAL NERVES: II:  No visual field defects.  Unremarkable fundi.   III-IV-VI: Pupils equal round and reactive to light.  Normal conjugate, extra-ocular eye movements in all directions of gaze.  No nystagmus.  No ptosis prior to or post sustained upgaze.   V:  Normal facial sensation.    VII:  Normal  facial symmetry and movements.   VIII:  Normal hearing and vestibular function.   IX-X:  Normal palatal movement.   XI:  Normal shoulder shrug and head rotation.   XII:  Normal tongue strength and range of motion, no deviation or fasciculation.  MOTOR:  No atrophy, fasciculations or abnormal movements.  No pronator drift.  Tone is normal.  Negative straight leg raise bilaterally.  Negative Faber test bilaterally  Right Upper Extremity:    Left Upper Extremity:  Deltoid  5/5   Deltoid  5/5   Biceps  5/5   Biceps  5/5   Triceps  5/5   Triceps  5/5   Wrist extensors  5/5   Wrist extensors  5/5   Wrist flexors  5/5   Wrist flexors  5/5   Finger extensors  5/5   Finger extensors  5/5   Finger flexors  5/5   Finger flexors  5/5   Dorsal interossei  5/5   Dorsal interossei  5/5   Abductor pollicis  5/5   Abductor pollicis  5/5   Tone (Ashworth scale)  0  Tone (Ashworth scale)  0   Right Lower Extremity:    Left Lower Extremity:    Hip flexors  5/5   Hip flexors  5/5   Hip extensors  5/5   Hip extensors  5/5   Knee flexors  5/5   Knee flexors  5/5   Knee extensors  5/5   Knee extensors  5/5   Dorsiflexors  5/5   Dorsiflexors  5/5   Plantarflexors  5/5   Plantarflexors  5/5   Toe extensors  5/5   Toe extensors  5/5   Toe flexors  5/5   Toe flexors  5/5   Tone (Ashworth scale)  0  Tone (Ashworth scale)  0   MSRs:  Right                                                                 Left brachioradialis 2+  brachioradialis 2+  biceps 2+  biceps 2+  triceps 2+  triceps 2+  patellar 2+  patellar 2+  ankle jerk 2  ankle jerk 2+  Hoffman no  Hoffman no  plantar response down  plantar response down   SENSORY:  Normal and symmetric perception of light touch, pinprick, vibration, and proprioception.  Romberg's sign absent.   COORDINATION/GAIT: Normal finger-to- nose-finger and heel-to-shin.  Intact rapid alternating movements bilaterally.  Able to rise from a chair without using arms.   Gait narrow based and stable. Tandem and stressed gait intact.    IMPRESSION: Mrs. Silos is a 52 year-old female presenting with low back pain and paresthesias of her posterior thighs.  Her neurological examination is normal and non-focal.  By history, she has radicular symptoms, however there is no deficits on exam and I am unable to reproduce any symptoms.  Lumbar strain or sciatica is also possible. I discussed that an EMG would be helpful to characterize her symptoms, but she would like to try conservative measures first and then consider EMG if paresthesias do not improve.     PLAN/RECOMMENDATIONS:  1.  Start taking neurontin 300mg  at bedtime for 3 days, if tolerating; increase to one tablet twice daily 2.  Continue taking flexiril 5mg  three times daily as needed 3.  Continue physical therapy 4.  Return to clinic in 100-months  The duration of this appointment visit was 60 minutes of face-to-face time with the patient.  Greater than 50% of this time was spent in counseling, explanation of diagnosis, planning of further management, and coordination of care.   Thank you for allowing me to participate in patient's care.  If I can answer any additional questions, I would be pleased  to do so.    Sincerely,    Landon Truax K. Posey Pronto, DO

## 2013-07-07 ENCOUNTER — Encounter: Payer: Self-pay | Admitting: *Deleted

## 2013-09-29 ENCOUNTER — Other Ambulatory Visit: Payer: Self-pay | Admitting: Neurology

## 2013-09-29 ENCOUNTER — Ambulatory Visit: Payer: Managed Care, Other (non HMO) | Admitting: Neurology

## 2013-09-30 ENCOUNTER — Telehealth: Payer: Self-pay | Admitting: Neurology

## 2013-09-30 MED ORDER — GABAPENTIN 100 MG PO CAPS: 100.0000 mg | ORAL_CAPSULE | Freq: Two times a day (BID) | ORAL | Status: DC

## 2013-09-30 NOTE — Telephone Encounter (Signed)
Spoke to pt, she will call me back today to r/s 09/29/13 appt w/ Dr. Posey Pronto. Our office was closed 09/29/13 d/t weather. Currently awaiting return call from patient / Sherri S.

## 2013-09-30 NOTE — Telephone Encounter (Signed)
Please notify patient I sent rx for gabapentin 100mg  twice daily to [facility] in [place].  Garyn Arlotta K. Posey Pronto, DO

## 2013-09-30 NOTE — Telephone Encounter (Signed)
Noted.  Holly Corvin K. Dalary Hollar, DO   

## 2013-09-30 NOTE — Telephone Encounter (Signed)
Notified patient. She states that she plans to titrate down from 300 to 200 for one week the 100 from that point on

## 2013-09-30 NOTE — Telephone Encounter (Signed)
Please advise 

## 2013-09-30 NOTE — Telephone Encounter (Signed)
Flexeril RX request received from pharmacy, per last office note patient to continue as needed. Refill approved and e-scribed to patient's pharmacy.

## 2013-09-30 NOTE — Telephone Encounter (Signed)
09/29/13 appt canc d/t weather. Our office to call and r/s appt. Spoke w/ pt 09/28/13, she asked that our clinical staff CB when the office re-opened. She needs refill and possible med change. Gabapentin was ordered 2x per day.She has only been taking it once per day. She would like to consider a new script for Gabapentin 100mg  (new strength) that she can take up to 3 times per day. She feels like current dose is too strong / Sherri S.

## 2013-10-10 ENCOUNTER — Other Ambulatory Visit: Payer: Self-pay | Admitting: Internal Medicine

## 2013-10-12 NOTE — Telephone Encounter (Signed)
I refilled 30 days worth.    Call pt and let her know that I need her mammogram report before I will refill any more hormones

## 2013-10-12 NOTE — Telephone Encounter (Signed)
Refill request MM Due notified pt that MM will need to be done before a full RX can be filled

## 2013-10-13 ENCOUNTER — Encounter: Payer: Self-pay | Admitting: Neurology

## 2013-10-13 ENCOUNTER — Ambulatory Visit (INDEPENDENT_AMBULATORY_CARE_PROVIDER_SITE_OTHER): Payer: BC Managed Care – PPO | Admitting: Neurology

## 2013-10-13 VITALS — BP 110/78 | HR 64 | Temp 98.0°F | Ht 64.96 in | Wt 161.5 lb

## 2013-10-13 DIAGNOSIS — M545 Low back pain, unspecified: Secondary | ICD-10-CM

## 2013-10-13 NOTE — Patient Instructions (Signed)
1. Continue physical therapy 2. Continue flexiril 5mg  twice daily as needed 3. Stop neurontin 4. Return to clinic 60-months

## 2013-10-13 NOTE — Progress Notes (Signed)
Follow-up Visit   Date: 10/13/2013    Holly Love MRN: [id number] DOB: 01-05-1961   Interim History: Holly Love is a 53 y.o. right-handed Caucasian female with history of low back pain returning to the clinic for follow-up of lumbar strain.  The patient was accompanied to the clinic by self.  She was last seen in the clinic on 07/03/2013.  History of present illness: In May 2013, she fell and since then she had developed slowly progressive low back pain. In April 2014, she developed shooting pain starting from her buttocks and radiating into her legs, worse on the right. Symptoms are better when she is standing, walking, or laying, but prolonged sitting and standing exacerbates her symptoms. She has associated tingling of the legs.  She saw Dr. Hal Morales who recommended physical therapy and NSAIDs. Therapy helped for a brief time and resolved her shooting pain, but she continues to have prickly sensation from her buttocks to her posterior thigh down to her knees. MRI of the lumbar spine showed grade I anterolisthesis at L4-5 with moderate facet hypertrophy without nerve impingement. She started seeing integrative therapy which also helps, but does not resolve her pain. She had tried heat, ice, and muscle relaxants. Of note, she had seen Dr. Erling Cruz in 2006 for chronic daily headaches.   - Follow-up 10/13/2013:  At her last visit, neurontin 300mg  qhs and flexiril 5mg  BID was started, but she did not tolerate neurontin so it was reduced to 100mg .  She is continuing PT once per week and reports having the most benefit with improvement of back pain. She continues to have mild tingling of the left posterior leg.  Prolonged standing and sitting exacerbates symptoms and laying prone improves it.  Tingling does not radiates into her lower leg. She is considering seeing a chiropractor for her symptoms.   Medications:  Current Outpatient Prescriptions on File Prior to Visit  Medication Sig  Dispense Refill  . acetaminophen (TYLENOL) 500 MG tablet Take 1,000 mg by mouth every 6 (six) hours as needed for pain.      . Calcium-Vitamin D-Vitamin K (CALCIUM + D + K PO) Take 2 tablets by mouth daily.       . cholecalciferol (VITAMIN D) 1000 UNITS tablet Take 1,000 Units by mouth daily.      . cyclobenzaprine (FLEXERIL) 5 MG tablet TAKE 1 TABLET (5 MG TOTAL) BY MOUTH 3 (THREE) TIMES DAILY AS NEEDED FOR MUSCLE SPASMS.  30 tablet  3  . estradiol (MINIVELLE) 0.05 MG/24HR patch Place patch on skin and change twice per week  8 patch  3  . fish oil-omega-3 fatty acids 1000 MG capsule Take 2 g by mouth daily.      Marland Kitchen gabapentin (NEURONTIN) 100 MG capsule Take 1 capsule (100 mg total) by mouth 2 (two) times daily.  60 capsule  3  . magnesium 30 MG tablet Take 30 mg by mouth 2 (two) times daily.      . magnesium oxide (MAG-OX) 400 MG tablet Take 200 mg by mouth daily.       . MELOXICAM PO Take 1 tablet by mouth daily.      Marland Kitchen MINIVELLE 0.05 MG/24HR patch PLACE PATCH ON SKIN AND CHANGE TWICE PER WEEK  8 patch  0  . OVER THE COUNTER MEDICATION 1 tablet daily. Vit A      . progesterone (PROMETRIUM) 100 MG capsule TAKE 1 CAPSULE (100 MG TOTAL) BY MOUTH DAILY.  30 capsule  4  .  progesterone (PROMETRIUM) 100 MG capsule TAKE 1 CAPSULE (100 MG TOTAL) BY MOUTH DAILY.  30 capsule  0  . pyridOXINE (B-6) 50 MG tablet Take 50 mg by mouth daily.      Marland Kitchen scopolamine (TRANSDERM-SCOP) 1.5 MG Place one patch behind ear 4 hours prior to boarding.   Change if needed every 3 days  4 patch  0  . traMADol (ULTRAM) 50 MG tablet Take by mouth every 6 (six) hours as needed.      . triamterene-hydrochlorothiazide (MAXZIDE-25) 37.5-25 MG per tablet TAKE 1 TABLET BY MOUTH EVERY MORNING  30 tablet  3   No current facility-administered medications on file prior to visit.    Allergies:  Allergies  Allergen Reactions  . Penicillins Hypertension     Review of Systems:  CONSTITUTIONAL: No fevers, chills, night sweats, or  weight loss.   EYES: No visual changes or eye pain ENT: No hearing changes.  No history of nose bleeds.   RESPIRATORY: No cough, wheezing and shortness of breath.   CARDIOVASCULAR: Negative for chest pain, and palpitations.   GI: Negative for abdominal discomfort, blood in stools or black stools.  No recent change in bowel habits.   GU:  No history of incontinence.   MUSCLOSKELETAL: No history of joint pain or swelling.  No myalgias.   SKIN: Negative for lesions, rash, and itching.   ENDOCRINE: Negative for cold or heat intolerance, polydipsia or goiter.   PSYCH:  No depression or anxiety symptoms.   NEURO: As Above.   Vital Signs:  BP 110/78  Pulse 64  Temp(Src) 98 F (36.7 C)  Ht 5' 4.96" (1.65 m)  Wt 161 lb 8 oz (73.256 kg)  BMI 26.91 kg/m2  Neurological Exam: MENTAL STATUS including orientation to time, place, person, recent and remote memory, attention span and concentration, language, and fund is knowledge is normal.  Speech is not dysarthric.  CRANIAL NERVES: Pupils equal round and reactive to light.  Normal conjugate, extra-ocular eye movements in all directions of gaze.  No ptosis. Face is symmetric. Palate elevates symmetrically.  Tongue is midline.  MOTOR:  Motor strength is 5/5 in all extremities.  Tone is normal.    MSRs:  Reflexes are 2+/4 throughout.  SENSORY:  Intact to vibration and light touch.  COORDINATION/GAIT:  Gait narrow based and stable. Stress gait intact.  She is able to perform deep knee bend.   Data: Lab Results  Component Value Date   TSH 4.003 07/01/2013   MRA brain 04/30/2005: negative   MRI of the brain 04/30/2005: unremarkable   MRI lumbar spine 02/10/2013: Grade I anterolisthesis at L4-5 with moderate facet hypertrophy and some uncovering of the disc. No focal stenosis is evident in the supine position   IMPRESSION: Low back pain with radicular symptoms - improving  Overall, her pain has improved with PT, but continues to have  tingling over the left posterior thigh (?S1 radiculopathy vs sciatica)  EMG can be done going forward, but given improving symptoms and normal exam, management would not change    PLAN/RECOMMENDATIONS:  1. Continue physical therapy 2. Continue flexiril 5mg  twice daily as needed 3. Stop neurontin 4. Return to clinic 30-months   The duration of this appointment visit was 20 minutes of face-to-face time with the patient.  Greater than 50% of this time was spent in counseling, explanation of diagnosis, planning of further management, and coordination of care.   Thank you for allowing me to participate in patient's care.  If  I can answer any additional questions, I would be pleased to do so.    Sincerely,    Velda Wendt K. Posey Pronto, DO

## 2013-10-27 ENCOUNTER — Telehealth: Payer: Self-pay | Admitting: Neurology

## 2013-10-27 NOTE — Telephone Encounter (Signed)
Pt would like top talk to someone about rx [phone]

## 2013-10-28 NOTE — Telephone Encounter (Signed)
LM for pt that Dr. Posey Pronto would like for her to come in.  Will have someone call her to set up the appt.

## 2013-10-28 NOTE — Telephone Encounter (Signed)
Let's have her follow-up.  Ashlee Bewley K. Posey Pronto, DO

## 2013-10-28 NOTE — Telephone Encounter (Signed)
Spoke with pt and she said that her pain in left hip and back of thigh is back.  Does not hurt in the morning but gets worse as the day goes on.  She was on Neurontin but did not like taking it because it made her feel fuzzy headed.  She took some Meloxicam and said that it did help some.  She will come in for appt if you would like her to.  Please advise. Thanks.

## 2013-11-17 ENCOUNTER — Ambulatory Visit (INDEPENDENT_AMBULATORY_CARE_PROVIDER_SITE_OTHER): Payer: BC Managed Care – PPO | Admitting: Neurology

## 2013-11-17 ENCOUNTER — Encounter: Payer: Self-pay | Admitting: Neurology

## 2013-11-17 VITALS — BP 114/84 | HR 85 | Wt 157.3 lb

## 2013-11-17 DIAGNOSIS — G5700 Lesion of sciatic nerve, unspecified lower limb: Secondary | ICD-10-CM

## 2013-11-17 DIAGNOSIS — G5702 Lesion of sciatic nerve, left lower limb: Secondary | ICD-10-CM

## 2013-11-17 DIAGNOSIS — IMO0002 Reserved for concepts with insufficient information to code with codable children: Secondary | ICD-10-CM

## 2013-11-17 DIAGNOSIS — M5410 Radiculopathy, site unspecified: Secondary | ICD-10-CM

## 2013-11-17 MED ORDER — PREGABALIN 50 MG PO CAPS: 50.0000 mg | ORAL_CAPSULE | Freq: Two times a day (BID) | ORAL | Status: DC

## 2013-11-17 MED ORDER — PREGABALIN 50 MG PO CAPS
50.0000 mg | ORAL_CAPSULE | Freq: Every day | ORAL | Status: DC
Start: 2013-11-17 — End: 2014-06-01

## 2013-11-17 NOTE — Progress Notes (Signed)
Follow-up Visit   Date: 11/17/2013    Holly Love MRN: [id number] DOB: 1960-11-10   Interim History: Holly Love is a 53 y.o. right-handed Caucasian female with history of low back pain returning to the clinic for follow-up of lumbar strain.  The patient was accompanied to the clinic by self.  She was last seen in the clinic on 10/13/2013.  History of present illness: In May 2013, she fell and since then she had developed slowly progressive low back pain. In April 2014, she developed shooting pain starting from her buttocks and radiating into her legs, worse on the right. Symptoms are better when she is standing, walking, or laying, but prolonged sitting and standing exacerbates her symptoms. She has associated tingling of the legs.  She saw Dr. Hal Morales who recommended physical therapy and NSAIDs. Therapy helped for a brief time and resolved her shooting pain, but she continues to have prickly sensation from her buttocks to her posterior thigh down to her knees. MRI of the lumbar spine showed grade I anterolisthesis at L4-5 with moderate facet hypertrophy without nerve impingement. She started seeing integrative therapy which also helps, but does not resolve her pain. She had tried heat, ice, and muscle relaxants. Of note, she had seen Dr. Erling Cruz in 2006 for chronic daily headaches.   - Follow-up 10/13/2013:  Did not tolerate neurontin so it was reduced to 100mg . She continues to have mild tingling of the left posterior leg.  Prolonged standing and sitting exacerbates symptoms and laying prone improves it.    - Follow-up 11/17/2013:  She tried to taper her medication and felt that her left leg electrical shock-like pain worsened, so she went back to take neurontin 100mg  BID.  Neurontin seems to provide benefit, but she is limited by its cognitive side effects, and feels she cannot concentrate well because of feeling "foggy".  She went is still working with PT once per week with stretching  the piriformis muscle and IT band and continues to do her home exercises.   Medications:  Current Outpatient Prescriptions on File Prior to Visit  Medication Sig Dispense Refill  . acetaminophen (TYLENOL) 500 MG tablet Take 1,000 mg by mouth every 6 (six) hours as needed for pain.      . Calcium-Vitamin D-Vitamin K (CALCIUM + D + K PO) Take 2 tablets by mouth daily.       . cholecalciferol (VITAMIN D) 1000 UNITS tablet Take 1,000 Units by mouth daily.      . cyclobenzaprine (FLEXERIL) 5 MG tablet TAKE 1 TABLET (5 MG TOTAL) BY MOUTH 3 (THREE) TIMES DAILY AS NEEDED FOR MUSCLE SPASMS.  30 tablet  3  . estradiol (MINIVELLE) 0.05 MG/24HR patch Place patch on skin and change twice per week  8 patch  3  . fish oil-omega-3 fatty acids 1000 MG capsule Take 2 g by mouth daily.      . magnesium 30 MG tablet Take 30 mg by mouth 2 (two) times daily.      . magnesium oxide (MAG-OX) 400 MG tablet Take 200 mg by mouth daily.       Marland Kitchen MINIVELLE 0.05 MG/24HR patch PLACE PATCH ON SKIN AND CHANGE TWICE PER WEEK  8 patch  0  . OVER THE COUNTER MEDICATION 1 tablet daily. Vit A      . progesterone (PROMETRIUM) 100 MG capsule TAKE 1 CAPSULE (100 MG TOTAL) BY MOUTH DAILY.  30 capsule  4  . progesterone (PROMETRIUM) 100 MG capsule  TAKE 1 CAPSULE (100 MG TOTAL) BY MOUTH DAILY.  30 capsule  0  . pyridOXINE (B-6) 50 MG tablet Take 50 mg by mouth daily.      Marland Kitchen scopolamine (TRANSDERM-SCOP) 1.5 MG Place one patch behind ear 4 hours prior to boarding.   Change if needed every 3 days  4 patch  0  . traMADol (ULTRAM) 50 MG tablet Take by mouth every 6 (six) hours as needed.      . triamterene-hydrochlorothiazide (MAXZIDE-25) 37.5-25 MG per tablet TAKE 1 TABLET BY MOUTH EVERY MORNING  30 tablet  3  . vitamin B-12 (CYANOCOBALAMIN) 100 MCG tablet Take 100 mcg by mouth daily.       No current facility-administered medications on file prior to visit.    Allergies:  Allergies  Allergen Reactions  . Penicillins Hypertension      Review of Systems:  CONSTITUTIONAL: No fevers, chills, night sweats, or weight loss.   EYES: No visual changes or eye pain ENT: No hearing changes.  No history of nose bleeds.   RESPIRATORY: No cough, wheezing and shortness of breath.   CARDIOVASCULAR: Negative for chest pain, and palpitations.   GI: Negative for abdominal discomfort, blood in stools or black stools.  No recent change in bowel habits.   GU:  No history of incontinence.   MUSCLOSKELETAL: No history of joint pain or swelling.  No myalgias.   SKIN: Negative for lesions, rash, and itching.   ENDOCRINE: Negative for cold or heat intolerance, polydipsia or goiter.   PSYCH:  No depression or anxiety symptoms.   NEURO: As Above.   Vital Signs:  BP 114/84  Pulse 85  Wt 157 lb 5 oz (71.356 kg)  SpO2 95%  Neurological Exam: MENTAL STATUS including orientation to time, place, person, recent and remote memory, attention span and concentration, language, and fund is knowledge is normal.  Speech is not dysarthric.  CRANIAL NERVES: Pupils equal round and reactive to light.  Normal conjugate, extra-ocular eye movements in all directions of gaze.  No ptosis. Face is symmetric. Palate elevates symmetrically.  Tongue is midline.  MOTOR:  Motor strength is 5/5 in all extremities.  Tone is normal.    MSRs:  Reflexes are 2+/4 throughout.  SENSORY:  Intact to vibration and light touch.  COORDINATION/GAIT:  Gait narrow based and stable. Stress gait intact.    Data: Lab Results  Component Value Date   TSH 4.003 07/01/2013   MRA brain 04/30/2005: negative   MRI of the brain 04/30/2005: unremarkable   MRI lumbar spine 02/10/2013: Grade I anterolisthesis at L4-5 with moderate facet hypertrophy and some uncovering of the disc. No focal stenosis is evident in the supine position   IMPRESSION: Low back pain with radicular symptoms   Etiology:  ?piriformis syndrome vs sciatica vs S1 radiculopathy  Clinically, slight worsening  of symptoms since stopping neurontin.  She feels that neurontin helps, but she is unable to function well even taking 100mg  BID.  Will offer a trial of Lyrica.  TCAs can be tried going forward  EMG discussed to better characterize symptoms, but patient declined   PLAN/RECOMMENDATIONS:  1. Continue physical therapy 2. Continue flexiril 5mg  twice daily as needed 3. Start Lyrica 50mg  daily x 1 week, then increase to one tablet twice daily 4. Stop neurontin 5. Return to clinic 53-months   The duration of this appointment visit was 25 minutes of face-to-face time with the patient.  Greater than 50% of this time was spent in counseling,  explanation of diagnosis, planning of further management, and coordination of care.   Thank you for allowing me to participate in patient's care.  If I can answer any additional questions, I would be pleased to do so.    Sincerely,    Neshia Mckenzie K. Posey Pronto, DO

## 2013-11-17 NOTE — Patient Instructions (Addendum)
1.  Start Lyrica 50mg  daily.  After one week, increase to one tablet twice daily. 2.  Stop neurontin 3.  Continue physical therapy 4.  Return to clinic in 68-months

## 2013-12-09 ENCOUNTER — Telehealth: Payer: Self-pay | Admitting: Neurology

## 2013-12-09 ENCOUNTER — Other Ambulatory Visit: Payer: Self-pay | Admitting: *Deleted

## 2013-12-09 MED ORDER — PREGABALIN 50 MG PO CAPS: 50.0000 mg | ORAL_CAPSULE | Freq: Two times a day (BID) | ORAL | Status: DC

## 2013-12-09 NOTE — Telephone Encounter (Signed)
LM notifying patient that her Rx and discount card are ready.

## 2013-12-09 NOTE — Telephone Encounter (Signed)
How many refills can I send in?

## 2013-12-09 NOTE — Telephone Encounter (Signed)
Lyrica samples worked well, would like script called in at [facility] in [place] / [address]

## 2013-12-09 NOTE — Telephone Encounter (Signed)
Lyrica 50mg  twice daily, #60, 5 refills.  Please check to see if we have Lyrica co-pay card to offer her that should help save her co-pay amount.  She'll need to come pick it up.  Augusten Lipkin K. Posey Pronto, DO

## 2014-02-08 ENCOUNTER — Other Ambulatory Visit: Payer: Self-pay | Admitting: Internal Medicine

## 2014-02-23 ENCOUNTER — Encounter: Payer: Self-pay | Admitting: Neurology

## 2014-02-23 ENCOUNTER — Ambulatory Visit (INDEPENDENT_AMBULATORY_CARE_PROVIDER_SITE_OTHER): Payer: BC Managed Care – PPO | Admitting: Neurology

## 2014-02-23 VITALS — BP 110/80 | HR 76 | Ht 63.78 in | Wt 161.4 lb

## 2014-02-23 DIAGNOSIS — M5430 Sciatica, unspecified side: Secondary | ICD-10-CM

## 2014-02-23 DIAGNOSIS — M5410 Radiculopathy, site unspecified: Secondary | ICD-10-CM

## 2014-02-23 DIAGNOSIS — M5442 Lumbago with sciatica, left side: Secondary | ICD-10-CM

## 2014-02-23 DIAGNOSIS — IMO0002 Reserved for concepts with insufficient information to code with codable children: Secondary | ICD-10-CM

## 2014-02-23 NOTE — Progress Notes (Signed)
Follow-up Visit   Date: 02/23/2014    Holly Love MRN: [id number] DOB: 1960-11-20   Interim History: Holly Love is a 53 y.o. right-handed Caucasian female with history of low back pain returning to the clinic for follow-up of lumbar strain.  The patient was accompanied to the clinic by self.  She was last seen in the clinic on 11/17/2013.  History of present illness: In May 2013, she fell and since then she had developed slowly progressive low back pain. In April 2014, she developed shooting pain starting from her buttocks and radiating into her legs, worse on the right. Symptoms are better when she is standing, walking, or laying, but prolonged sitting and standing exacerbates her symptoms. She has associated tingling of the legs.  She saw Dr. Hal Morales who recommended physical therapy and NSAIDs. Therapy helped for a brief time and resolved her shooting pain, but she continues to have prickly sensation from her buttocks to her posterior thigh down to her knees. MRI of the lumbar spine showed grade I anterolisthesis at L4-5 with moderate facet hypertrophy without nerve impingement. She started seeing integrative therapy which also helps, but does not resolve her pain. She had tried heat, ice, and muscle relaxants. Of note, she had seen Dr. Erling Cruz in 2006 for chronic daily headaches.   - Follow-up 10/13/2013:  Did not tolerate neurontin so it was reduced to 100mg . She continues to have mild tingling of the left posterior leg.  Prolonged standing and sitting exacerbates symptoms and laying prone improves it.    - Follow-up 11/17/2013:  She tried to taper her medication and felt that her left leg electrical shock-like pain worsened, so she went back to take neurontin 100mg  BID.  Neurontin seems to provide benefit, but she is limited by its cognitive side effects, and feels she cannot concentrate well because of feeling "foggy".  She went is still working with PT once per week with stretching  the piriformis muscle and IT band and continues to do her home exercises.  UPDATE 02/23/2014:  After taking lyrica 50mg , pain is improved but she feels sleepy so reduced it to once a day.  She lifted a watermelon last week and was kayaking and developed left leg sharp pain.  She tried icing it which slowly improved discomfort, but is planning on seeking the opinion of orthopedic surgery to make sure this is not hip related problem.     Medications:  Current Outpatient Prescriptions on File Prior to Visit  Medication Sig Dispense Refill  . acetaminophen (TYLENOL) 500 MG tablet Take 1,000 mg by mouth every 6 (six) hours as needed for pain.      . Calcium-Vitamin D-Vitamin K (CALCIUM + D + K PO) Take 2 tablets by mouth daily.       . cholecalciferol (VITAMIN D) 1000 UNITS tablet Take 1,000 Units by mouth daily.      Marland Kitchen estradiol (MINIVELLE) 0.05 MG/24HR patch Place patch on skin and change twice per week  8 patch  3  . fish oil-omega-3 fatty acids 1000 MG capsule Take 2 g by mouth daily.      . magnesium 30 MG tablet Take 30 mg by mouth 2 (two) times daily.      . magnesium oxide (MAG-OX) 400 MG tablet Take 200 mg by mouth daily.       Marland Kitchen OVER THE COUNTER MEDICATION 1 tablet daily. Vit A      . pregabalin (LYRICA) 50 MG capsule Take 1  capsule (50 mg total) by mouth daily.  42 capsule  0  . progesterone (PROMETRIUM) 100 MG capsule TAKE 1 CAPSULE (100 MG TOTAL) BY MOUTH DAILY.  30 capsule  4  . pyridOXINE (B-6) 50 MG tablet Take 50 mg by mouth daily.      . traMADol (ULTRAM) 50 MG tablet Take by mouth every 6 (six) hours as needed.      . triamterene-hydrochlorothiazide (MAXZIDE-25) 37.5-25 MG per tablet TAKE 1 TABLET BY MOUTH EVERY MORNING  30 tablet  3  . vitamin B-12 (CYANOCOBALAMIN) 100 MCG tablet Take 100 mcg by mouth daily.       No current facility-administered medications on file prior to visit.    Allergies:  Allergies  Allergen Reactions  . Penicillins Hypertension     Review of  Systems:  CONSTITUTIONAL: No fevers, chills, night sweats, or weight loss.   EYES: No visual changes or eye pain ENT: No hearing changes.  No history of nose bleeds.   RESPIRATORY: No cough, wheezing and shortness of breath.   CARDIOVASCULAR: Negative for chest pain, and palpitations.   GI: Negative for abdominal discomfort, blood in stools or black stools.  No recent change in bowel habits.   GU:  No history of incontinence.   MUSCLOSKELETAL: No history of joint pain or swelling.  No myalgias.   SKIN: Negative for lesions, rash, and itching.   ENDOCRINE: Negative for cold or heat intolerance, polydipsia or goiter.   PSYCH:  No depression or anxiety symptoms.   NEURO: As Above.   Vital Signs:  BP 110/80  Pulse 76  Ht 5' 3.78" (1.62 m)  Wt 161 lb 6 oz (73.199 kg)  BMI 27.89 kg/m2  SpO2 98%  Neurological Exam: MENTAL STATUS including orientation to time, place, person, recent and remote memory, attention span and concentration, language, and fund is knowledge is normal.  Speech is not dysarthric.  CRANIAL NERVES:  Pupils equal round and reactive to light.  Normal conjugate, extra-ocular eye movements in all directions of gaze.  No ptosis. Face is symmetric.   MOTOR:  Motor strength is 5/5 in all extremities.  Tone is normal.    MSRs:  Reflexes are 2+/4 throughout.  SENSORY:  Intact to vibration and light touch.  COORDINATION/GAIT:  Gait narrow based and stable. Stress gait intact.    Data: Lab Results  Component Value Date   TSH 4.003 07/01/2013   MRA brain 04/30/2005: negative   MRI of the brain 04/30/2005: unremarkable   MRI lumbar spine 02/10/2013: Grade I anterolisthesis at L4-5 with moderate facet hypertrophy and some uncovering of the disc. No focal stenosis is evident in the supine position   IMPRESSION/PLAN: Low back pain with radicular symptoms  - Etiology:  ?piriformis syndrome vs sciatica vs S1 radiculopathy.    - Patient has been reluctant to have  electrodiagnostic testing in the past, but is agreeable EMG of the left lower extremity now. - In the meantime, continue Lyrica 50 mg daily. She is concerned about possible weight gain associated with this medication, however it is such a low dose that I am surprised she is even getting any symptomatic benefit.  - If pain acutely worsens, a trial of prednisone can be offered - She will be seeking the opinion of an orthopedic doctor to see if symptoms could be hip related   Return to clinic in 3 months or sooner as needed    The duration of this appointment visit was 25 minutes of face-to-face time  with the patient.  Greater than 50% of this time was spent in counseling, explanation of diagnosis, planning of further management, and coordination of care.   Thank you for allowing me to participate in patient's care.  If I can answer any additional questions, I would be pleased to do so.    Sincerely,    Donika K. Posey Pronto, DO

## 2014-02-23 NOTE — Patient Instructions (Signed)
1.  EMG of the left > right leg 2.  Continue Lyrica 50mg  daily 3.  Continue home exercises 4.  If symptoms worsen, may consider steroid trial.  5.  Other options to consider is evaluated by Dr. Hulan Saas (Sports Medicine) 6.  Return to clinic in 3 months

## 2014-03-18 ENCOUNTER — Telehealth: Payer: Self-pay | Admitting: Neurology

## 2014-03-18 NOTE — Telephone Encounter (Signed)
Pt called on 03/18/14 at 10:40AM to cancel her EMG appt on 03/29/14.  She stated that she feels better and does not want to proceed with the EMG unless she had to.

## 2014-03-29 ENCOUNTER — Encounter: Payer: BC Managed Care – PPO | Admitting: Neurology

## 2014-05-14 ENCOUNTER — Other Ambulatory Visit: Payer: Self-pay | Admitting: Internal Medicine

## 2014-05-14 NOTE — Telephone Encounter (Signed)
Holly Love  Call pt and let her know that I sent 2 months of medication for her and give her a 30 min OV to see me before I will refill any more.  I have not seen her since 06/2013  Thanks

## 2014-05-14 NOTE — Telephone Encounter (Signed)
I spoke with patient and set her up for a F/U appt. On 06/01/14.-eh

## 2014-05-14 NOTE — Telephone Encounter (Signed)
Refill request

## 2014-05-26 ENCOUNTER — Encounter: Payer: Self-pay | Admitting: Neurology

## 2014-05-26 ENCOUNTER — Ambulatory Visit (INDEPENDENT_AMBULATORY_CARE_PROVIDER_SITE_OTHER): Payer: BC Managed Care – PPO | Admitting: Neurology

## 2014-05-26 VITALS — BP 110/84 | HR 74 | Ht 64.0 in | Wt 161.4 lb

## 2014-05-26 DIAGNOSIS — R42 Dizziness and giddiness: Secondary | ICD-10-CM

## 2014-05-26 DIAGNOSIS — M5410 Radiculopathy, site unspecified: Secondary | ICD-10-CM

## 2014-05-26 DIAGNOSIS — G5702 Lesion of sciatic nerve, left lower limb: Secondary | ICD-10-CM

## 2014-05-26 DIAGNOSIS — M792 Neuralgia and neuritis, unspecified: Secondary | ICD-10-CM

## 2014-05-26 DIAGNOSIS — R278 Other lack of coordination: Secondary | ICD-10-CM

## 2014-05-26 DIAGNOSIS — M5442 Lumbago with sciatica, left side: Secondary | ICD-10-CM

## 2014-05-26 MED ORDER — NORTRIPTYLINE HCL 10 MG PO CAPS: 10.0000 mg | ORAL_CAPSULE | Freq: Every day | ORAL | Status: DC

## 2014-05-26 MED ORDER — BACLOFEN 10 MG PO TABS: 10.0000 mg | ORAL_TABLET | Freq: Every day | ORAL | Status: DC

## 2014-05-26 NOTE — Patient Instructions (Addendum)
1.  Start nortriptyline 10mg  at bedtime 2.  After one week of being on nortripyline, start baclofen 5mg  (half tablet) at bedtime for one week, then increase to 1 tablet at bedtime 3.  Stop Lyrica  4.  Check blood work 5.  Return to clinic 55-months

## 2014-05-26 NOTE — Progress Notes (Signed)
Follow-up Visit   Date: 05/26/2014    Holly Love MRN: [id number] DOB: 1961-07-11   Interim History: Holly Love is a 53 y.o. right-handed Caucasian female with history of low back pain returning to the clinic for follow-up of lumbar strain.  The patient was accompanied to the clinic by self.    History of present illness: In May 2013, she fell and since then she had developed slowly progressive low back pain. In April 2014, she developed shooting pain starting from her buttocks and radiating into her legs, worse on the right. Symptoms are better when she is standing, walking, or laying, but prolonged sitting and standing exacerbates her symptoms. She has associated tingling of the legs.  She saw Dr. Hal Morales who recommended physical therapy and NSAIDs. Therapy helped for a brief time and resolved her shooting pain, but she continues to have prickly sensation from her buttocks to her posterior thigh down to her knees. MRI of the lumbar spine showed grade I anterolisthesis at L4-5 with moderate facet hypertrophy without nerve impingement. She started seeing integrative therapy which also helps, but does not resolve her pain. She had tried heat, ice, and muscle relaxants. Of note, she had seen Dr. Erling Cruz in 2006 for chronic daily headaches.   - Follow-up 10/13/2013:  Did not tolerate neurontin so it was reduced to 100mg . She continues to have mild tingling of the left posterior leg.  Prolonged standing and sitting exacerbates symptoms and laying prone improves it.    - Follow-up 11/17/2013:  She tried to taper her medication and felt that her left leg electrical shock-like pain worsened, so she went back to take neurontin 100mg  BID.  Neurontin seems to provide benefit, but she is limited by its cognitive side effects, and feels she cannot concentrate well because of feeling "foggy".  She went is still working with PT once per week with stretching the piriformis muscle and IT band and  continues to do her home exercises.  - Follow-up 02/23/2014:  After taking lyrica 50mg , pain is improved but she feels sleepy so reduced it to once a day.  She lifted a watermelon last week and was kayaking and developed left leg sharp pain.  She tried icing it which slowly improved discomfort, but is planning on seeking the opinion of orthopedic surgery to make sure this is not hip related problem.    UPDATE 05/26/2014:  She increased Lyrica to 50mg  BID because of breakthrough pain, which helps but she feels that she is more tired and groggy.  Since June 2014, she feels that her balance is off and attributed it to her blood pressure medication because it was started a few weeks prior to symptom onset.  No associated dizziness or room-spinning.  Symptoms are intermittent and worse when she is active.  No falls.   Medications:  Current Outpatient Prescriptions on File Prior to Visit  Medication Sig Dispense Refill  . acetaminophen (TYLENOL) 500 MG tablet Take 1,000 mg by mouth every 6 (six) hours as needed for pain.      . Calcium-Vitamin D-Vitamin K (CALCIUM + D + K PO) Take 2 tablets by mouth daily.       . cholecalciferol (VITAMIN D) 1000 UNITS tablet Take 1,000 Units by mouth daily.      Marland Kitchen estradiol (MINIVELLE) 0.05 MG/24HR patch Place patch on skin and change twice per week  8 patch  3  . fish oil-omega-3 fatty acids 1000 MG capsule Take 2 g by  mouth daily.      . magnesium 30 MG tablet Take 30 mg by mouth 2 (two) times daily.      . magnesium oxide (MAG-OX) 400 MG tablet Take 200 mg by mouth daily.       Marland Kitchen OVER THE COUNTER MEDICATION 1 tablet daily. Vit A      . pregabalin (LYRICA) 50 MG capsule Take 1 capsule (50 mg total) by mouth daily.  42 capsule  0  . progesterone (PROMETRIUM) 100 MG capsule TAKE 1 CAPSULE (100 MG TOTAL) BY MOUTH DAILY.  30 capsule  4  . pyridOXINE (B-6) 50 MG tablet Take 50 mg by mouth daily.      . traMADol (ULTRAM) 50 MG tablet Take by mouth every 6 (six) hours as  needed.      . triamterene-hydrochlorothiazide (MAXZIDE-25) 37.5-25 MG per tablet TAKE 1 TABLET BY MOUTH EVERY MORNING  30 tablet  1  . vitamin B-12 (CYANOCOBALAMIN) 100 MCG tablet Take 100 mcg by mouth daily.       No current facility-administered medications on file prior to visit.    Allergies:  Allergies  Allergen Reactions  . Penicillins Hypertension     Review of Systems:  CONSTITUTIONAL: No fevers, chills, night sweats, or weight loss.   EYES: No visual changes or eye pain ENT: No hearing changes.  No history of nose bleeds.   RESPIRATORY: No cough, wheezing and shortness of breath.   CARDIOVASCULAR: Negative for chest pain, and palpitations.   GI: Negative for abdominal discomfort, blood in stools or black stools.  No recent change in bowel habits.   GU:  No history of incontinence.   MUSCLOSKELETAL: + history of joint pain or swelling.  No myalgias.   SKIN: Negative for lesions, rash, and itching.   ENDOCRINE: Negative for cold or heat intolerance, polydipsia or goiter.   PSYCH:  No depression or anxiety symptoms.   NEURO: As Above.   Vital Signs:  BP 110/84  Pulse 74  Ht 5\' 4"  (1.626 m)  Wt 161 lb 7 oz (73.228 kg)  BMI 27.70 kg/m2  SpO2 99%  Neurological Exam: MENTAL STATUS including orientation to time, place, person, recent and remote memory, attention span and concentration, language, and fund is knowledge is normal.  Speech is not dysarthric.  CRANIAL NERVES:  Pupils equal round and reactive to light.  Normal conjugate, extra-ocular eye movements in all directions of gaze. No nystagmus. No ptosis. Face is symmetric. Palate elevates symmetrically.  Tongue is midline.  MOTOR:  Motor strength is 5/5 in all extremities.  Tone is normal.    MSRs:  Reflexes are 2+/4 throughout.  SENSORY:  Intact to vibration and light touch.  COORDINATION/GAIT:  Finger to nose testing intact.  Gait narrow based and stable. Tandem gait intact.    Data: Lab Results  Component  Value Date   TSH 4.003 07/01/2013   MRA brain 04/30/2005: negative   MRI of the brain 04/30/2005: unremarkable   MRI lumbar spine 02/10/2013: Grade I anterolisthesis at L4-5 with moderate facet hypertrophy and some uncovering of the disc. No focal stenosis is evident in the supine position   IMPRESSION/PLAN: 1.  Low back pain with radicular symptoms. Etiology:  ?piriformis syndrome vs sciatica vs S1 radiculopathy.    - Patient has been reluctant to have electrodiagnostic testing.  Imaging of MRI lumbar spine does not show any nerve impingement - Stop Lyrica due to potentially contributing to lightheadedness.   - Start nortriptyline 10mg  at bedtime.  Common side effects discussed - Start balcofen 10mg  at bedtime - If pain acutely worsens, a trial of prednisone can be offered - She will be seeking the opinion of an orthopedic doctor to see if symptoms could be hip related   2.  Lightheadedness - Check vitamin B12, TSH, vitamin E, copper, zinc, vitamin B6  - Neurological exam is normal, will stop Lyrica to see if that helps.  Alternative, symptoms may be side effect of blood pressure medications and will discuss with her PCP  3.  Return to clinic in 3 months or sooner as needed    The duration of this appointment visit was 30 minutes of face-to-face time with the patient.  Greater than 50% of this time was spent in counseling, explanation of diagnosis, planning of further management, and coordination of care.   Thank you for allowing me to participate in patient's care.  If I can answer any additional questions, I would be pleased to do so.    Sincerely,    Donika K. Posey Pronto, DO

## 2014-05-28 ENCOUNTER — Other Ambulatory Visit: Payer: Self-pay

## 2014-06-01 ENCOUNTER — Encounter: Payer: Self-pay | Admitting: Internal Medicine

## 2014-06-01 ENCOUNTER — Telehealth: Payer: Self-pay | Admitting: Neurology

## 2014-06-01 ENCOUNTER — Ambulatory Visit (INDEPENDENT_AMBULATORY_CARE_PROVIDER_SITE_OTHER): Payer: BC Managed Care – PPO | Admitting: Internal Medicine

## 2014-06-01 VITALS — BP 98/60 | HR 78 | Temp 98.0°F | Resp 16 | Ht 64.0 in | Wt 161.0 lb

## 2014-06-01 DIAGNOSIS — Z23 Encounter for immunization: Secondary | ICD-10-CM

## 2014-06-01 DIAGNOSIS — I10 Essential (primary) hypertension: Secondary | ICD-10-CM | POA: Diagnosis not present

## 2014-06-01 DIAGNOSIS — IMO0001 Reserved for inherently not codable concepts without codable children: Secondary | ICD-10-CM

## 2014-06-01 DIAGNOSIS — R030 Elevated blood-pressure reading, without diagnosis of hypertension: Secondary | ICD-10-CM | POA: Diagnosis not present

## 2014-06-01 DIAGNOSIS — N951 Menopausal and female climacteric states: Secondary | ICD-10-CM

## 2014-06-01 DIAGNOSIS — E559 Vitamin D deficiency, unspecified: Secondary | ICD-10-CM

## 2014-06-01 LAB — CBC WITH DIFFERENTIAL/PLATELET
Basophils Absolute: 0.1 10*3/uL (ref 0.0–0.1)
Basophils Relative: 1 % (ref 0–1)
EOS PCT: 2 % (ref 0–5)
Eosinophils Absolute: 0.1 10*3/uL (ref 0.0–0.7)
HEMATOCRIT: 39.7 % (ref 36.0–46.0)
HEMOGLOBIN: 13.5 g/dL (ref 12.0–15.0)
LYMPHS ABS: 1.9 10*3/uL (ref 0.7–4.0)
LYMPHS PCT: 36 % (ref 12–46)
MCH: 31.2 pg (ref 26.0–34.0)
MCHC: 34 g/dL (ref 30.0–36.0)
MCV: 91.7 fL (ref 78.0–100.0)
MONO ABS: 0.4 10*3/uL (ref 0.1–1.0)
MONOS PCT: 8 % (ref 3–12)
NEUTROS ABS: 2.8 10*3/uL (ref 1.7–7.7)
Neutrophils Relative %: 53 % (ref 43–77)
Platelets: 279 10*3/uL (ref 150–400)
RBC: 4.33 MIL/uL (ref 3.87–5.11)
RDW: 12.6 % (ref 11.5–15.5)
WBC: 5.3 10*3/uL (ref 4.0–10.5)

## 2014-06-01 LAB — COMPLETE METABOLIC PANEL WITH GFR
ALT: 29 U/L (ref 0–35)
AST: 22 U/L (ref 0–37)
Albumin: 4.5 g/dL (ref 3.5–5.2)
Alkaline Phosphatase: 66 U/L (ref 39–117)
BUN: 12 mg/dL (ref 6–23)
CO2: 29 meq/L (ref 19–32)
CREATININE: 0.8 mg/dL (ref 0.50–1.10)
Calcium: 9.5 mg/dL (ref 8.4–10.5)
Chloride: 101 mEq/L (ref 96–112)
GFR, Est Non African American: 85 mL/min
Glucose, Bld: 83 mg/dL (ref 70–99)
Potassium: 3.9 mEq/L (ref 3.5–5.3)
Sodium: 139 mEq/L (ref 135–145)
Total Bilirubin: 0.7 mg/dL (ref 0.2–1.2)
Total Protein: 6.6 g/dL (ref 6.0–8.3)

## 2014-06-01 LAB — LIPID PANEL
Cholesterol: 215 mg/dL — ABNORMAL HIGH (ref 0–200)
HDL: 50 mg/dL (ref >39)
LDL CALC: 138 mg/dL — AB (ref 0–99)
Total CHOL/HDL Ratio: 4.3 Ratio
Triglycerides: 133 mg/dL (ref <150)
VLDL: 27 mg/dL (ref 0–40)

## 2014-06-01 NOTE — Telephone Encounter (Signed)
Dr. Sharin Mons office called. Dr. Posey Pronto ordered labs at last appt but labs have not been resulted in [facility] yet. Their office needs a copy. Please call [facility] and check on status of labs. Pt did have blood drawn.  Call Margaretha Sheffield w/ Dr. Sharin Mons office once results are available in [place] / [facility].

## 2014-06-01 NOTE — Patient Instructions (Signed)
See me in 4 weeks 

## 2014-06-01 NOTE — Progress Notes (Signed)
   Subjective:    Patient ID: Holly Love, female    DOB: 1960-10-17, 53 y.o.   MRN: [id number]  HPI 06/2013 Subjective dizziness Ct neg She has upcoming appt with neurologist  htn On maxide will check BMP today  See me as needed  Neurology note 1. Low back pain with radicular symptoms. Etiology: ?piriformis syndrome vs sciatica vs S1 radiculopathy.  - Patient has been reluctant to have electrodiagnostic testing. Imaging of MRI lumbar spine does not show any nerve impingement  - Stop Lyrica due to potentially contributing to lightheadedness.  - Start nortriptyline 10mg  at bedtime. Common side effects discussed  - Start balcofen 10mg  at bedtime  - If pain acutely worsens, a trial of prednisone can be offered  - She will be seeking the opinion of an orthopedic doctor to see if symptoms could be hip related  2. Lightheadedness  - Check vitamin B12, TSH, vitamin E, copper, zinc, vitamin B6  - Neurological exam is normal, will stop Lyrica to see if that helps. Alternative, symptoms may be side effect of blood pressure medications and will discuss with her PCP  3. Return to clinic in 3 months or sooner as needed  The duration of this appointment visit was 30 minutes of face-to-face time with the patient. Greater than 50% of this time was spent in counseling, explanation of diagnosis, planning of further management, and coordination of care.    Today :    Claudene has stopped her Lyrica and just began her nortriptyline and baclofen      She reports dizziness when walkin down the hallway.  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  Tolerating HT well no flushing, no vaginal spotting.  Began 01/2013       Review of Systems    see HPI Objective:   Physical Exam  Physical Exam  Nursing note and vitals reviewed.  Orthostatic BP  112/62 lying  98/60 standing  Constitutional: She is oriented to person, place, and time. She appears well-developed and  well-nourished.  HENT:  Head: Normocephalic and atraumatic.  Cardiovascular: Normal rate and regular rhythm. Exam reveals no gallop and no friction rub.  No murmur heard.  Pulmonary/Chest: Breath sounds normal. She has no wheezes. She has no rales.  Neurological: She is alert and oriented to person, place, and time.  Skin: Skin is warm and dry.  Ext  No edema Psychiatric: She has a normal mood and affect. Her behavior is normal.         Assessment & Plan:  DizzinessLlightheadedness  She is slightly orthostatic on exam    Will hold maxide for now    Lumbar radiculopathy:  She could not tolerate lyrica.    Just began nortiptyline and baclofen   See me in 4 weeks.    Flu vaccine today   Lipids  Chemistries, vitamin D today

## 2014-06-01 NOTE — Telephone Encounter (Signed)
Labs faxed to [facility].

## 2014-06-02 LAB — VITAMIN D 25 HYDROXY (VIT D DEFICIENCY, FRACTURES): Vit D, 25-Hydroxy: 56 ng/mL (ref 30–89)

## 2014-06-08 NOTE — Progress Notes (Signed)
I spoke with patient about lab results. I also mailed her a copy and with a copy of the dash diet.-eh

## 2014-06-11 ENCOUNTER — Other Ambulatory Visit: Payer: Self-pay | Admitting: Internal Medicine

## 2014-06-11 NOTE — Telephone Encounter (Signed)
Refill request

## 2014-06-14 ENCOUNTER — Encounter: Payer: Self-pay | Admitting: Internal Medicine

## 2014-06-14 NOTE — Telephone Encounter (Signed)
I spoke with Holly Love and she said we were not suppose to receive this request. Her OBGYN takes care of this.-eh

## 2014-06-29 ENCOUNTER — Ambulatory Visit (INDEPENDENT_AMBULATORY_CARE_PROVIDER_SITE_OTHER): Payer: BC Managed Care – PPO | Admitting: Internal Medicine

## 2014-06-29 ENCOUNTER — Encounter: Payer: Self-pay | Admitting: Internal Medicine

## 2014-06-29 VITALS — BP 120/82 | HR 85 | Temp 98.3°F | Resp 16 | Ht 64.0 in | Wt 158.0 lb

## 2014-06-29 DIAGNOSIS — R42 Dizziness and giddiness: Secondary | ICD-10-CM | POA: Diagnosis not present

## 2014-06-29 DIAGNOSIS — R030 Elevated blood-pressure reading, without diagnosis of hypertension: Secondary | ICD-10-CM | POA: Diagnosis not present

## 2014-06-29 DIAGNOSIS — M5416 Radiculopathy, lumbar region: Secondary | ICD-10-CM

## 2014-06-29 DIAGNOSIS — IMO0001 Reserved for inherently not codable concepts without codable children: Secondary | ICD-10-CM

## 2014-06-29 NOTE — Progress Notes (Signed)
Subjective:    Patient ID: Holly Love, female    DOB: May 24, 1961, 53 y.o.   MRN: [id number]  HPI 06/01/2014 DizzinessLlightheadedness She is slightly orthostatic on exam Will hold maxide for now   Lumbar radiculopathy: She could not tolerate lyrica. Just began nortiptyline and baclofen   See me in 4 weeks.   Flu vaccine today Lipids Chemistries, vitamin D today   Today's note  Off maxide now.  Still feels "off balance" feeling at times that is different from her Vertigo she has had n the past.   She has been evaluated by an audioloigist.   Pt had neg MRI/MRA in 2006  Lumbar radiculopathy:   She is using low dose nortriptyline and this helps.    She has been trying to lose weight and has lost 5 Lbs since last year    Allergies  Allergen Reactions  . Penicillins Hypertension   Past Medical History  Diagnosis Date  . Thyroid disease     Previously on medications, now off   Past Surgical History  Procedure Laterality Date  . Cesarean section     History   Social History  . Marital Status: Married    Spouse Name: N/A    Number of Children: N/A  . Years of Education: N/A   Occupational History  . Not on file.   Social History Main Topics  . Smoking status: Never Smoker   . Smokeless tobacco: Never Used  . Alcohol Use: 0.0 oz/week    1-2 Glasses of wine per week     Comment: Drinks wine about 5 glasses per week  . Drug Use: No  . Sexual Activity: Yes   Other Topics Concern  . Not on file   Social History Narrative   She works as a Music therapist   She lives with husband.  They have two children.   Family History  Problem Relation Age of Onset  . Cancer Mother 10    breast  . Kidney disease Mother   . Hyperlipidemia Father    Patient Active Problem List   Diagnosis Date Noted  . Elevated TSH 06/16/2013  . Hot flushes, perimenopausal 01/06/2013  . FH: breast cancer in first degree relative 01/06/2013  . Forgetfulness  01/06/2013  . Essential hypertension, benign 12/17/2012  . Subclinical hypothyroidism 12/11/2012  . Gastroenteritis 08/26/2012  . Hyperlipidemia 07/31/2012  . Elevated blood pressure 07/20/2012  . Anxiety 07/20/2012  . DJD (degenerative joint disease) 07/20/2012  . Perimenopause 07/20/2012  . Osteopenia 07/20/2012  . Hypothyroidism 07/20/2012  . MUSCLE SPASM, TRAPEZIUS 07/26/2009  . KNEE PAIN, BILATERAL 06/16/2009   Current Outpatient Prescriptions on File Prior to Visit  Medication Sig Dispense Refill  . baclofen (LIORESAL) 10 MG tablet Take 1 tablet (10 mg total) by mouth at bedtime. 30 each 3  . cholecalciferol (VITAMIN D) 1000 UNITS tablet Take 1,000 Units by mouth daily.    Marland Kitchen estradiol (MINIVELLE) 0.05 MG/24HR patch Place patch on skin and change twice per week 8 patch 3  . fish oil-omega-3 fatty acids 1000 MG capsule Take 2 g by mouth daily.    . magnesium 30 MG tablet Take 30 mg by mouth 2 (two) times daily.    . magnesium oxide (MAG-OX) 400 MG tablet Take 200 mg by mouth daily.     Marland Kitchen MINIVELLE 0.05 MG/24HR patch PLACE PATCH ON SKIN AND CHANGE TWICE PER WEEK 8 patch 0  . nortriptyline (PAMELOR) 10 MG capsule Take 1 capsule (10 mg  total) by mouth at bedtime. 30 capsule 5  . progesterone (PROMETRIUM) 100 MG capsule TAKE 1 CAPSULE (100 MG TOTAL) BY MOUTH DAILY. 30 capsule 4  . triamterene-hydrochlorothiazide (MAXZIDE-25) 37.5-25 MG per tablet TAKE 1 TABLET BY MOUTH EVERY MORNING 30 tablet 1  . vitamin B-12 (CYANOCOBALAMIN) 100 MCG tablet Take 100 mcg by mouth daily.     No current facility-administered medications on file prior to visit.       Review of Systems See HPI    Objective:   Physical Exam  Physical Exam  Nursing note and vitals reviewed.  Bp My exam  128/82  Orthostatics are normal Constitutional: She is oriented to person, place, and time. She appears well-developed and well-nourished.  HENT:  Head: Normocephalic and atraumatic.  Cardiovascular: Normal rate  and regular rhythm. Exam reveals no gallop and no friction rub.  No murmur heard.  Pulmonary/Chest: Breath sounds normal. She has no wheezes. She has no rales.  Neurological: She is alert and oriented to person, place, and time.  Skin: Skin is warm and dry.  Psychiatric: She has a normal mood and affect. Her behavior is normal.         Assessment & Plan:  Dizziness  May be related to vestibulitis/labrynthitis  . Pt states she would like to see Dr. Thornell Mule and will make her own appt with him. Ok to refer  Lumbar radiculopathy  Continue Nortriptyline  Intentional weight loss

## 2014-07-20 ENCOUNTER — Telehealth: Payer: Self-pay

## 2014-07-20 NOTE — Telephone Encounter (Signed)
Holly Love [phone] - work number okay to leave message  [name] called to say she does need a referral for ENT, she wanted Dr. Laren Everts, but his new patient appointments are out until June. So she wanted to know if there was someone Dr Rudene Anda could recommend, that she could get into sooner. She also stated that we would need to send her records from her former Internal medicine doctor and her hearing exam.

## 2014-07-20 NOTE — Telephone Encounter (Signed)
I spoke with Judeen Hammans and she is set up with Dr. Jenell Milliner on 07/28/14@ 9:45

## 2014-07-29 ENCOUNTER — Encounter: Payer: Self-pay | Admitting: *Deleted

## 2014-08-19 ENCOUNTER — Encounter: Payer: Self-pay | Admitting: *Deleted

## 2014-08-26 ENCOUNTER — Encounter: Payer: Self-pay | Admitting: Neurology

## 2014-08-26 ENCOUNTER — Ambulatory Visit (INDEPENDENT_AMBULATORY_CARE_PROVIDER_SITE_OTHER): Payer: BLUE CROSS/BLUE SHIELD | Admitting: Neurology

## 2014-08-26 VITALS — BP 118/80 | HR 84 | Ht 64.0 in | Wt 159.1 lb

## 2014-08-26 DIAGNOSIS — M5442 Lumbago with sciatica, left side: Secondary | ICD-10-CM

## 2014-08-26 NOTE — Progress Notes (Signed)
Follow-up Visit   Date: 08/26/2014    Holly Love MRN: [id number] DOB: July 13, 1961   Interim History: Holly Love is a 54 y.o. right-handed Caucasian female with history of low back pain returning to the clinic for follow-up of low back pain.  The patient was accompanied to the clinic by self.    History of present illness: In May 2013, she fell and since then she had developed slowly progressive low back pain. In April 2014, she developed shooting pain starting from her buttocks and radiating into her legs, worse on the right. Symptoms are better when she is standing, walking, or laying, but prolonged sitting and standing exacerbates her symptoms. She has associated tingling of the legs.  She saw Dr. Hal Morales who recommended physical therapy and NSAIDs. Therapy helped for a brief time and resolved her shooting pain, but she continues to have prickly sensation from her buttocks to her posterior thigh down to her knees. MRI of the lumbar spine showed grade I anterolisthesis at L4-5 with moderate facet hypertrophy without nerve impingement. She started seeing integrative therapy which also helps, but does not resolve her pain. She had tried heat, ice, and muscle relaxants. Of note, she had seen Dr. Erling Cruz in 2006 for chronic daily headaches.   - Follow-up 10/13/2013:  Did not tolerate neurontin so it was reduced to 100mg . She continues to have mild tingling of the left posterior leg.  Prolonged standing and sitting exacerbates symptoms and laying prone improves it.    - Follow-up 11/17/2013:  She tried to taper her medication and felt that her left leg electrical shock-like pain worsened, so she went back to take neurontin 100mg  BID.  Neurontin seems to provide benefit, but she is limited by its cognitive side effects, and feels she cannot concentrate well because of feeling "foggy".  She went is still working with PT once per week with stretching the piriformis muscle and IT band and  continues to do her home exercises.  - Follow-up 02/23/2014:  After taking lyrica 50mg , pain is improved but she feels sleepy so reduced it to once a day.  She lifted a watermelon last week and was kayaking and developed left leg sharp pain.  She tried icing it which slowly improved discomfort, but is planning on seeking the opinion of orthopedic surgery to make sure this is not hip related problem.    - Follow-up 05/26/2014:  She increased Lyrica to 50mg  BID because of breakthrough pain, which helps but she feels that she is more tired and groggy.  Since June 2014, she feels that her balance is off and attributed it to her blood pressure medication because it was started a few weeks prior to symptom onset.    UPDATE 08/26/2014:  Her right leg pain and paresthesias is stable.  She is planning on having epidiral steroid injection by Dr. Rolena Infante [facility]).  She is tolerating the nortriptyline and notices about 80% improvement, but interesting stopping it because of tiredness.     Medications:  Current Outpatient Prescriptions on File Prior to Visit  Medication Sig Dispense Refill  . cholecalciferol (VITAMIN D) 1000 UNITS tablet Take 1,000 Units by mouth daily.    Marland Kitchen estradiol (MINIVELLE) 0.05 MG/24HR patch Place patch on skin and change twice per week 8 patch 3  . fish oil-omega-3 fatty acids 1000 MG capsule Take 2 g by mouth daily.    . magnesium oxide (MAG-OX) 400 MG tablet Take 200 mg by mouth daily.     Marland Kitchen  nortriptyline (PAMELOR) 10 MG capsule   5  . progesterone (PROMETRIUM) 100 MG capsule TAKE 1 CAPSULE (100 MG TOTAL) BY MOUTH DAILY. 30 capsule 4  . vitamin B-12 (CYANOCOBALAMIN) 100 MCG tablet Take 100 mcg by mouth daily.     No current facility-administered medications on file prior to visit.    Allergies:  Allergies  Allergen Reactions  . Penicillins Hypertension     Review of Systems:  CONSTITUTIONAL: No fevers, chills, night sweats, or weight loss.   EYES: No visual  changes or eye pain ENT: No hearing changes.  No history of nose bleeds.   RESPIRATORY: No cough, wheezing and shortness of breath.   CARDIOVASCULAR: Negative for chest pain, and palpitations.   GI: Negative for abdominal discomfort, blood in stools or black stools.  No recent change in bowel habits.   GU:  No history of incontinence.   MUSCLOSKELETAL: + history of joint pain or swelling.  No myalgias.   SKIN: Negative for lesions, rash, and itching.   ENDOCRINE: Negative for cold or heat intolerance, polydipsia or goiter.   PSYCH:  No depression or anxiety symptoms.   NEURO: As Above.   Vital Signs:  BP 118/80 mmHg  Pulse 84  Ht 5\' 4"  (1.626 m)  Wt 159 lb 1 oz (72.15 kg)  BMI 27.29 kg/m2  SpO2 98%  LMP 06/03/2013  Neurological Exam: MENTAL STATUS including orientation to time, place, person, recent and remote memory, attention span and concentration, language, and fund is knowledge is normal.  Speech is not dysarthric.  CRANIAL NERVES:  Pupils equal round and reactive to light.  Normal conjugate, extra-ocular eye movements in all directions of gaze. No nystagmus.  Face is symmetric. Palate elevates symmetrically.  Tongue is midline.  MOTOR:  Motor strength is 5/5 in all extremities.    MSRs:  Reflexes are 2+/4 throughout.  COORDINATION/GAIT:   Gait narrow based and stable. Tandem gait intact.    Data: Lab Results  Component Value Date   TSH 4.003 07/01/2013   MRA brain 04/30/2005: negative   MRI of the brain 04/30/2005: unremarkable   MRI lumbar spine 02/10/2013: Grade I anterolisthesis at L4-5 with moderate facet hypertrophy and some uncovering of the disc. No focal stenosis is evident in the supine position   IMPRESSION/PLAN: 1.  Low back pain with radicular symptoms, likely sciatica  - Patient has been reluctant to have electrodiagnostic testing.  Imaging of MRI lumbar spine does not show any nerve impingement  - Previously tried:  Lyrica (lightheadedness),  neurontin  - Continue nortriptyline 10mg  at bedtime, which she is tolerating very well.    - Take baclofen 10mg  prn muscle spasms  - She is scheduled for steroid injection with orthopeadics (Dr. Rolena Infante) in the next few weeks  2.  Return to clinic in 38-months or sooner as needed     The duration of this appointment visit was 15 minutes of face-to-face time with the patient.  Greater than 50% of this time was spent in counseling, explanation of diagnosis, planning of further management, and coordination of care.   Thank you for allowing me to participate in patient's care.  If I can answer any additional questions, I would be pleased to do so.    Sincerely,    Makyra Corprew K. Posey Pronto, DO

## 2014-08-26 NOTE — Patient Instructions (Addendum)
1.  Continue your medications as you are 2.  Return to clinic in 11-months, or sooner as needed

## 2014-09-06 ENCOUNTER — Encounter: Payer: Self-pay | Admitting: Internal Medicine

## 2014-09-06 ENCOUNTER — Ambulatory Visit (INDEPENDENT_AMBULATORY_CARE_PROVIDER_SITE_OTHER): Payer: BLUE CROSS/BLUE SHIELD | Admitting: Internal Medicine

## 2014-09-06 VITALS — BP 116/79 | HR 78 | Temp 98.6°F | Resp 16 | Ht 64.0 in | Wt 159.0 lb

## 2014-09-06 DIAGNOSIS — H539 Unspecified visual disturbance: Secondary | ICD-10-CM

## 2014-09-06 DIAGNOSIS — J0100 Acute maxillary sinusitis, unspecified: Secondary | ICD-10-CM

## 2014-09-06 DIAGNOSIS — R05 Cough: Secondary | ICD-10-CM

## 2014-09-06 DIAGNOSIS — R059 Cough, unspecified: Secondary | ICD-10-CM

## 2014-09-06 MED ORDER — AZITHROMYCIN 250 MG PO TABS
ORAL_TABLET | ORAL | Status: DC
Start: 2014-09-06 — End: 2015-02-24

## 2014-09-06 MED ORDER — HYDROCODONE-HOMATROPINE 5-1.5 MG/5ML PO SYRP: 5.0000 mL | ORAL_SOLUTION | Freq: Three times a day (TID) | ORAL | Status: DC | PRN

## 2014-09-06 NOTE — Patient Instructions (Signed)
See me as needed 

## 2014-09-06 NOTE — Progress Notes (Signed)
Subjective:    Patient ID: Holly Love, female    DOB: 03-31-1961, 54 y.o.   MRN: [id number]  HPI  Holly Love is here for acute visit  Non-productive cough and nasal stuffiness  Green nasal discharge   No fever no chest pain no SOB no wheezing  Also reports she will be seeing her neurologist Holly Love as she was told she had an abnromal vision test when she saw her ENT Holly Love.   She has been having some blurred vision.   Allergies  Allergen Reactions  . Penicillins Hypertension   Past Medical History  Diagnosis Date  . Thyroid disease     Previously on medications, now off   Past Surgical History  Procedure Laterality Date  . Cesarean section     History   Social History  . Marital Status: Married    Spouse Name: N/A    Number of Children: N/A  . Years of Education: N/A   Occupational History  . Not on file.   Social History Main Topics  . Smoking status: Never Smoker   . Smokeless tobacco: Never Used  . Alcohol Use: 0.0 oz/week    1-2 Glasses of wine per week     Comment: Drinks wine about 5 glasses per week  . Drug Use: No  . Sexual Activity: Yes   Other Topics Concern  . Not on file   Social History Narrative   She works as a Music therapist   She lives with husband.  They have two children.   Family History  Problem Relation Age of Onset  . Cancer Mother 83    breast  . Kidney disease Mother   . Hyperlipidemia Father    Patient Active Problem List   Diagnosis Date Noted  . Elevated TSH 06/16/2013  . Hot flushes, perimenopausal 01/06/2013  . FH: breast cancer in first degree relative 01/06/2013  . Forgetfulness 01/06/2013  . Essential hypertension, benign 12/17/2012  . Subclinical hypothyroidism 12/11/2012  . Gastroenteritis 08/26/2012  . Hyperlipidemia 07/31/2012  . Elevated blood pressure 07/20/2012  . Anxiety 07/20/2012  . DJD (degenerative joint disease) 07/20/2012  . Perimenopause 07/20/2012  . Osteopenia 07/20/2012  .  Hypothyroidism 07/20/2012  . MUSCLE SPASM, TRAPEZIUS 07/26/2009  . KNEE PAIN, BILATERAL 06/16/2009   Current Outpatient Prescriptions on File Prior to Visit  Medication Sig Dispense Refill  . cholecalciferol (VITAMIN D) 1000 UNITS tablet Take 1,000 Units by mouth daily.    Marland Kitchen estradiol (MINIVELLE) 0.05 MG/24HR patch Place patch on skin and change twice per week 8 patch 3  . fish oil-omega-3 fatty acids 1000 MG capsule Take 2 g by mouth daily.    . magnesium oxide (MAG-OX) 400 MG tablet Take 200 mg by mouth daily.     . nortriptyline (PAMELOR) 10 MG capsule   5  . progesterone (PROMETRIUM) 100 MG capsule TAKE 1 CAPSULE (100 MG TOTAL) BY MOUTH DAILY. 30 capsule 4  . vitamin B-12 (CYANOCOBALAMIN) 100 MCG tablet Take 100 mcg by mouth daily.     No current facility-administered medications on file prior to visit.       Review of Systems See HPI    Objective:   Physical Exam  Physical Exam  Nursing note and vitals reviewed.  Constitutional: She is oriented to person, place, and time. She appears well-developed and well-nourished.  HENT:  Head: Normocephalic and atraumatic.  Cardiovascular: Normal rate and regular rhythm. Exam reveals no gallop and no friction rub.  No murmur  heard.  Pulmonary/Chest: Breath sounds normal. She has no wheezes. She has no rales.  Neurological: She is alert and oriented to person, place, and time.  Skin: Skin is warm and dry.  Psychiatric: She has a normal mood and affect. Her behavior is normal.             Assessment & Plan:  Likely viral sinusitis  Advised to hold off on any antibioitic for the next 3 days.  Ok for Afrin nasal spray.  Z-pak if not better in 3 days  Cough Hycodan prn  ?? Visual changes  2006 MRI/MRA unrevealing  - performed for migraines.  Advised to be sure to see her neurologist.  Pt wishes to make her own appt

## 2014-11-16 ENCOUNTER — Other Ambulatory Visit: Payer: Self-pay | Admitting: Neurology

## 2015-01-19 ENCOUNTER — Other Ambulatory Visit: Payer: Self-pay | Admitting: Orthopedic Surgery

## 2015-01-19 DIAGNOSIS — M25532 Pain in left wrist: Secondary | ICD-10-CM

## 2015-01-20 ENCOUNTER — Ambulatory Visit
Admission: RE | Admit: 2015-01-20 | Discharge: 2015-01-20 | Disposition: A | Discharge status: Home / Self Care | Payer: BLUE CROSS/BLUE SHIELD | Source: Ambulatory Visit | Attending: Orthopedic Surgery | Admitting: Orthopedic Surgery

## 2015-01-20 DIAGNOSIS — M25532 Pain in left wrist: Secondary | ICD-10-CM

## 2015-02-24 ENCOUNTER — Ambulatory Visit (INDEPENDENT_AMBULATORY_CARE_PROVIDER_SITE_OTHER): Payer: BLUE CROSS/BLUE SHIELD | Admitting: Neurology

## 2015-02-24 ENCOUNTER — Encounter: Payer: Self-pay | Admitting: Neurology

## 2015-02-24 VITALS — BP 130/70 | HR 88 | Ht 64.0 in | Wt 159.6 lb

## 2015-02-24 DIAGNOSIS — R42 Dizziness and giddiness: Secondary | ICD-10-CM | POA: Diagnosis not present

## 2015-02-24 DIAGNOSIS — R278 Other lack of coordination: Secondary | ICD-10-CM

## 2015-02-24 DIAGNOSIS — R292 Abnormal reflex: Secondary | ICD-10-CM

## 2015-02-24 DIAGNOSIS — M5442 Lumbago with sciatica, left side: Secondary | ICD-10-CM

## 2015-02-24 DIAGNOSIS — R202 Paresthesia of skin: Secondary | ICD-10-CM

## 2015-02-24 MED ORDER — NORTRIPTYLINE HCL 10 MG PO CAPS: 10.0000 mg | ORAL_CAPSULE | Freq: Every day | ORAL | Status: DC

## 2015-02-24 NOTE — Patient Instructions (Addendum)
1.  MRI brain wwo contrast 2.  Increase nortriptyline 20mg  at bedtime 3.  EMG of bilateral lower extremities 4.  Return to clinic in 3 month

## 2015-02-24 NOTE — Progress Notes (Signed)
Follow-up Visit   Date: 02/24/2015     Holly Love MRN: [id number] DOB: 10/21/1960   Interim History: Holly Love is a 54 y.o. right-handed Caucasian female with history of low back pain returning to the clinic for follow-up of low back pain and new complaints of dizziness.  The patient was accompanied to the clinic by self.    History of present illness: In May 2013, she fell and since then she had developed slowly progressive low back pain. In April 2014, she developed shooting pain starting from her buttocks and radiating into her legs, worse on the right. Symptoms are better when she is standing, walking, or laying, but prolonged sitting and standing exacerbates her symptoms. She has associated tingling of the legs.  She saw Dr. Hal Morales who recommended physical therapy and NSAIDs. Therapy helped for a brief time and resolved her shooting pain, but she continues to have prickly sensation from her buttocks to her posterior thigh down to her knees. MRI of the lumbar spine showed grade I anterolisthesis at L4-5 with moderate facet hypertrophy without nerve impingement. She started seeing integrative therapy which also helps, but does not resolve her pain. She had tried heat, ice, and muscle relaxants. Of note, she had seen Dr. Erling Cruz in 2006 for chronic daily headaches.   - Follow-up 10/13/2013:  Did not tolerate neurontin so it was reduced to 100mg . She continues to have mild tingling of the left posterior leg.  Prolonged standing and sitting exacerbates symptoms and laying prone improves it.    - Follow-up 11/17/2013:  She tried to taper her medication and felt that her left leg electrical shock-like pain worsened, so she went back to take neurontin 100mg  BID.  Neurontin seems to provide benefit, but she is limited by its cognitive side effects, and feels she cannot concentrate well because of feeling "foggy".  She went is still working with PT once per week with stretching the  piriformis muscle and IT band and continues to do her home exercises.  - Follow-up 02/23/2014:  After taking lyrica 50mg , pain is improved but she feels sleepy so reduced it to once a day.  She lifted a watermelon last week and was kayaking and developed left leg sharp pain.  She tried icing it which slowly improved discomfort, but is planning on seeking the opinion of orthopedic surgery to make sure this is not hip related problem.    - Follow-up 05/26/2014:  She increased Lyrica to 50mg  BID because of breakthrough pain, which helps but she feels that she is more tired and groggy.  Since June 2014, she feels that her balance is off and attributed it to her blood pressure medication because it was started a few weeks prior to symptom onset.    - Follow-up 08/26/2014:  Her right leg pain and paresthesias is stable.  She is planning on having epidiral steroid injection by Dr. Rolena Infante [facility] orthopead[facility]).  She is tolerating the nortriptyline and notices about 80% improvement, but interesting stopping it because of tiredness.    UPDATE 02/24/2015:  She was traveling in [place] and slipped on wet steps and broke her left wrist.  She complains of persistent lightheadedness and is concerned that it may be due to nortriptyline 10mg , but reports having significant improvement of the pain with it so does not wish to taper it.  She is seeing Dr. Nelva Bush for her neuropathic pain and is undecided whether nerve ablation would help or not.  She is still  seeing integrative therapy.  Because of ongoing issues of dizziness, right foot paresthesias, and her muscle tightness, she is concerned about multiple sclerosis.   Medications:  Current Outpatient Prescriptions on File Prior to Visit  Medication Sig Dispense Refill  . cholecalciferol (VITAMIN D) 1000 UNITS tablet Take 1,000 Units by mouth daily.    Marland Kitchen estradiol (MINIVELLE) 0.05 MG/24HR patch Place patch on skin and change twice per week 8 patch 3  . fish oil-omega-3  fatty acids 1000 MG capsule Take 2 g by mouth daily.    . magnesium oxide (MAG-OX) 400 MG tablet Take 200 mg by mouth daily.     . progesterone (PROMETRIUM) 100 MG capsule TAKE 1 CAPSULE (100 MG TOTAL) BY MOUTH DAILY. 30 capsule 4  . vitamin B-12 (CYANOCOBALAMIN) 100 MCG tablet Take 100 mcg by mouth daily.     No current facility-administered medications on file prior to visit.    Allergies:  Allergies  Allergen Reactions  . Penicillins Hypertension     Review of Systems:  CONSTITUTIONAL: No fevers, chills, night sweats, or weight loss.   EYES: No visual changes or eye pain ENT: No hearing changes.  No history of nose bleeds.   RESPIRATORY: No cough, wheezing and shortness of breath.   CARDIOVASCULAR: Negative for chest pain, and palpitations.   GI: Negative for abdominal discomfort, blood in stools or black stools.  No recent change in bowel habits.   GU:  No history of incontinence.   MUSCLOSKELETAL: + history of joint pain or swelling.  No myalgias.   SKIN: Negative for lesions, rash, and itching.   ENDOCRINE: Negative for cold or heat intolerance, polydipsia or goiter.   PSYCH:  No depression or anxiety symptoms.   NEURO: As Above.   Vital Signs:  BP 130/70 mmHg  Pulse 88  Ht 5\' 4"  (1.626 m)  Wt 159 lb 9 oz (72.377 kg)  BMI 27.38 kg/m2  SpO2 98%  LMP 06/03/2013  Neurological Exam: MENTAL STATUS including orientation to time, place, person, recent and remote memory, attention span and concentration, language, and fund is knowledge is normal.  Speech is not dysarthric.  CRANIAL NERVES:  Pupils equal round and reactive to light.  Normal conjugate, extra-ocular eye movements in all directions of gaze. No nystagmus.  Face is symmetric. Palate elevates symmetrically.  Tongue is midline.  MOTOR:  Motor strength is 5/5 in all extremities.    MSRs:  Reflexes are 3+/4 in throughout, except 2+/4 Achilles bilaterally.   Normal tone.  SENSORY:  Vibration intact  throughout.  COORDINATION/GAIT:   Gait narrow based and stable. Tandem gait intact.    Data: Lab Results  Component Value Date   TSH 4.003 07/01/2013   MRA brain 04/30/2005: negative   MRI of the brain 04/30/2005: unremarkable   MRI lumbar spine 02/10/2013: Grade I anterolisthesis at L4-5 with moderate facet hypertrophy and some uncovering of the disc. No focal stenosis is evident in the supine position   IMPRESSION/PLAN: 1.  Low back pain with left radicular symptoms, likely sciatica and right foot paresthesias  - Patient has been reluctant to have electrodiagnostic testing in the past, but now has agreed to proceed with EMG of the lower extremities  - MRI lumbar spine does not show any nerve impingement  - Previously tried:  Lyrica (lightheadedness), neurontin  - She is having marked benefit with nortriptyline 10mg  so would recommend increasing to 20mg  qhs, unless there is worsening dizziness  - Continue baclofen 10mg  prn muscle spasms  2.  Dizziness/lightheadedness  - With her brisk reflexes and pachy parethesias and pain, will order MRI brain to evaluate for demyelinating disease, although my clinical suspicion is low.  MRI brain last performed in 2006 which was normal  2.  Return to clinic in 19-months or sooner as needed     The duration of this appointment visit was 25 minutes of face-to-face time with the patient.  Greater than 50% of this time was spent in counseling, explanation of diagnosis, planning of further management, and coordination of care.   Thank you for allowing me to participate in patient's care.  If I can answer any additional questions, I would be pleased to do so.    Sincerely,    Cruise Baumgardner K. Posey Pronto, DO

## 2015-03-03 ENCOUNTER — Ambulatory Visit
Admission: RE | Admit: 2015-03-03 | Discharge: 2015-03-03 | Disposition: A | Discharge status: Home / Self Care | Payer: BLUE CROSS/BLUE SHIELD | Source: Ambulatory Visit | Attending: Neurology | Admitting: Neurology

## 2015-03-03 DIAGNOSIS — M5442 Lumbago with sciatica, left side: Secondary | ICD-10-CM

## 2015-03-03 DIAGNOSIS — R278 Other lack of coordination: Secondary | ICD-10-CM

## 2015-03-03 DIAGNOSIS — R202 Paresthesia of skin: Secondary | ICD-10-CM

## 2015-03-03 DIAGNOSIS — R292 Abnormal reflex: Secondary | ICD-10-CM

## 2015-03-03 DIAGNOSIS — R42 Dizziness and giddiness: Secondary | ICD-10-CM

## 2015-03-03 MED ORDER — GADOBENATE DIMEGLUMINE 529 MG/ML IV SOLN
14.0000 mL | Freq: Once | INTRAVENOUS | Status: AC | PRN
  Administered 2015-03-03: 14 mL via INTRAVENOUS

## 2015-03-04 ENCOUNTER — Telehealth: Payer: Self-pay | Admitting: Neurology

## 2015-03-04 NOTE — Telephone Encounter (Signed)
Results of MRI brian discussed with patient.  Mild white matter changes are less likely demyelinating changes, so I do not feel that we need additional CSF testing at this time.  If she develops any new neurological symptoms, this can be considered.    She is still having dizziness and I suggested that we can switch her to another TCA since she is having great symptomatic benefit with nortriptyline.  Desipramine was recommended, but she would like to hold on it until after the EMG.  Holly K. Posey Pronto, DO

## 2015-03-15 ENCOUNTER — Ambulatory Visit (INDEPENDENT_AMBULATORY_CARE_PROVIDER_SITE_OTHER): Payer: BLUE CROSS/BLUE SHIELD | Admitting: Neurology

## 2015-03-15 DIAGNOSIS — R292 Abnormal reflex: Secondary | ICD-10-CM

## 2015-03-15 DIAGNOSIS — R278 Other lack of coordination: Secondary | ICD-10-CM

## 2015-03-15 DIAGNOSIS — M5442 Lumbago with sciatica, left side: Secondary | ICD-10-CM

## 2015-03-15 DIAGNOSIS — R42 Dizziness and giddiness: Secondary | ICD-10-CM

## 2015-03-15 DIAGNOSIS — R202 Paresthesia of skin: Secondary | ICD-10-CM

## 2015-03-15 NOTE — Procedures (Signed)
[facility]  [address], [address]  [place], [place] [address] Tel: [phone] Fax:  [phone] Test Date:  03/15/2015  Patient: Holly Love DOB: 1961-05-11 Physician: Narda Amber, DO  Sex: Female Height: 5\' 4"  Ref Phys: Narda Amber, DO  ID#: [id number]   Technician:    Patient Complaints: This is a 54 year-old female presenting for left leg pain and right foot paresthesias.  NCV & EMG Findings: Extensive electrodiagnostic testing of the right lower extremity and additional studies of the left shows:  1. Bilateral sural and superficial peroneal sensory responses are within normal limits. 2. Bilateral tibial and peroneal motor responses are within normal limits. 3. There is no evidence of active or chronic motor axon loss changes affecting any of the tested muscles. Motor unit configuration and recruitment pattern is within normal limits.  Impression: This is a normal study of the lower extremities. In particular there is no evidence of a generalized sensorimotor polyneuropathy or lumbosacral radiculopathy.   ___________________________ Narda Amber, DO    Nerve Conduction Studies Anti Sensory Summary Table   Site NR Peak (ms) Norm Peak (ms) P-T Amp (V) Norm P-T Amp  Left Sup Peroneal Anti Sensory (Ant Lat Mall)  12 cm    2.9 <4.6 10.0 >4  Right Sup Peroneal Anti Sensory (Ant Lat Mall)  12 cm    2.7 <4.6 14.1 >4  Left Sural Anti Sensory (Lat Mall)  Calf    3.6 <4.6 10.1 >4  Right Sural Anti Sensory (Lat Mall)  Calf    3.5 <4.6 10.7 >4   Motor Summary Table   Site NR Onset (ms) Norm Onset (ms) O-P Amp (mV) Norm O-P Amp Site1 Site2 Delta-0 (ms) Dist (cm) Vel (m/s) Norm Vel (m/s)  Left Peroneal Motor (Ext Dig Brev)  Ankle    2.7 <6.0 7.8 >2.5 B Fib Ankle 6.1 30.0 49 >40  B Fib    8.8  7.3  Poplt B Fib 1.8 10.0 56 >40  Poplt    10.6  7.0         Right Peroneal Motor (Ext Dig Brev)  Ankle    2.4 <6.0 6.9 >2.5 B Fib Ankle 6.3 33.0 52 >40  B Fib     8.7  6.3  Poplt B Fib 1.3 8.0 62 >40  Poplt    10.0  6.2         Left Tibial Motor (Abd Hall Brev)  Ankle    3.9 <6.0 13.4 >4 Knee Ankle 7.0 37.0 53 >40  Knee    10.9  8.2         Right Tibial Motor (Abd Hall Brev)  Ankle    4.2 <6.0 16.6 >4 Knee Ankle 6.7 35.0 52 >40  Knee    10.9  14.0          EMG   Side Muscle Ins Act Fibs Psw Fasc Number Recrt Dur Dur. Amp Amp. Poly Poly. Comment  Right Gastroc Nml Nml Nml Nml Nml Nml Nml Nml Nml Nml Nml Nml N/A  Right Flex Dig Long Nml Nml Nml Nml Nml Nml Nml Nml Nml Nml Nml Nml N/A  Right AntTibialis Nml Nml Nml Nml Nml Nml Nml Nml Nml Nml Nml Nml N/A  Right GluteusMed Nml Nml Nml Nml Nml Nml Nml Nml Nml Nml Nml Nml N/A  Right RectFemoris Nml Nml Nml Nml Nml Nml Nml Nml Nml Nml Nml Nml N/A  Left AntTibialis Nml Nml Nml Nml Nml Nml Nml Nml  Nml Nml Nml Nml N/A  Left Gastroc Nml Nml Nml Nml Nml Nml Nml Nml Nml Nml Nml Nml N/A  Left RectFemoris Nml Nml Nml Nml Nml Nml Nml Nml Nml Nml Nml Nml N/A  Left GluteusMed Nml Nml Nml Nml Nml Nml Nml Nml Nml Nml Nml Nml N/A  Left BicepsFemS Nml Nml Nml Nml Nml Nml Nml Nml Nml Nml Nml Nml N/A      Waveforms:

## 2015-05-17 DIAGNOSIS — G43109 Migraine with aura, not intractable, without status migrainosus: Secondary | ICD-10-CM | POA: Insufficient documentation

## 2015-05-17 DIAGNOSIS — M5137 Other intervertebral disc degeneration, lumbosacral region: Secondary | ICD-10-CM | POA: Insufficient documentation

## 2015-05-24 ENCOUNTER — Telehealth: Payer: Self-pay | Admitting: Internal Medicine

## 2015-05-24 NOTE — Telephone Encounter (Signed)
Pt came in to fill out release form to request medical records from [facility] at [facility] request     Thanks.

## 2015-05-30 ENCOUNTER — Encounter: Payer: Self-pay | Admitting: Neurology

## 2015-05-30 ENCOUNTER — Ambulatory Visit (INDEPENDENT_AMBULATORY_CARE_PROVIDER_SITE_OTHER): Payer: BLUE CROSS/BLUE SHIELD | Admitting: Neurology

## 2015-05-30 VITALS — BP 112/74 | HR 80 | Ht 65.0 in | Wt 157.0 lb

## 2015-05-30 DIAGNOSIS — M5442 Lumbago with sciatica, left side: Secondary | ICD-10-CM | POA: Diagnosis not present

## 2015-05-30 DIAGNOSIS — G3184 Mild cognitive impairment, so stated: Secondary | ICD-10-CM

## 2015-05-30 LAB — VITAMIN B12: Vitamin B-12: 680 pg/mL (ref 211–911)

## 2015-05-30 MED ORDER — NORTRIPTYLINE HCL 10 MG PO CAPS: 10.0000 mg | ORAL_CAPSULE | Freq: Every day | ORAL | Status: DC

## 2015-05-30 NOTE — Progress Notes (Signed)
Follow-up Visit   Date: 05/30/2015     Holly Love MRN: [id number] DOB: [birth year]   Interim History: Holly Love is a 54 y.o. right-handed Caucasian female with history of low back pain returning to the clinic for follow-up of low back pain and new complaints of dizziness.  The patient was accompanied to the clinic by self.    History of present illness: In May 2013, she fell and since then she had developed slowly progressive low back pain. In April 2014, she developed shooting pain starting from her buttocks and radiating into her legs, worse on the right. Symptoms are better when she is standing, walking, or laying, but prolonged sitting and standing exacerbates her symptoms. She has associated tingling of the legs. Therapy helped for a brief time and resolved her shooting pain, but she continues to have prickly sensation from her buttocks to her posterior thigh down to her knees. MRI of the lumbar spine showed grade I anterolisthesis at L4-5 with moderate facet hypertrophy without nerve impingement. She had tried heat, ice, and muscle relaxants. Of note, she had seen Dr. Erling Cruz in 2006 for chronic daily headaches.   - Follow-up 10/13/2013:  Did not tolerate neurontin so it was reduced to 100mg . She continues to have mild tingling of the left posterior leg.  Prolonged standing and sitting exacerbates symptoms and laying prone improves it.    - Follow-up 11/17/2013:  She tried to taper her medication and felt that her left leg electrical shock-like pain worsened, so she went back to take neurontin 100mg  BID.  Neurontin seems to provide benefit, but she is limited by its cognitive side effects, and feels she cannot concentrate well because of feeling "foggy".  She went is still working with PT once per week with stretching the piriformis muscle and IT band and continues to do her home exercises.  - Follow-up 02/23/2014:  After taking lyrica 50mg , pain is improved but she feels  sleepy so reduced it to once a day.  She lifted a watermelon last week and was kayaking and developed left leg sharp pain.  She tried icing it which slowly improved discomfort, but is planning on seeking the opinion of orthopedic surgery to make sure this is not hip related problem.    - Follow-up 05/26/2014:  She increased Lyrica to 50mg  BID because of breakthrough pain, which helps but she feels that she is more tired and groggy.  Since June 2014, she feels that her balance is off and attributed it to her blood pressure medication because it was started a few weeks prior to symptom onset.    - Follow-up 08/26/2014:  Her right leg pain and paresthesias is stable.  She is planning on having epidiral steroid injection by Dr. Rolena Infante [facility]).  She is tolerating the nortriptyline and notices about 80% improvement, but interesting stopping it because of tiredness.    UPDATE 02/24/2015:  She was traveling in [place] and slipped on wet steps and broke her left wrist.  She complains of persistent lightheadedness and is concerned that it may be due to nortriptyline 10mg , but reports having significant improvement of the pain with it so does not wish to taper it.  She is seeing Dr. Nelva Bush for her neuropathic pain and is undecided whether nerve ablation would help or not.  She is still seeing integrative therapy.  Because of ongoing issues of dizziness, right foot paresthesias, and her muscle tightness, she is concerned about multiple sclerosis.  UPDATE  05/30/2015:  She tried increasing nortriptyline to 20mg  but developed grogginess the following day, so reduced it back down to 10mg  and that seems to be controlling her paresthesias and pain.  She takes baclofen 10mg  for severe muscle spasm and takes it about once every three weeks.  She also complains of problems with short-term recall, such as names and places.  She forgot where she parked in the mall on one occasion. She does a number of speeches for  work and has noticed some difficulty remember the names or words, so practices often.  She is still doing all IADLs and ADLs without difficulty.  She has not been involved in any MVAs.   Medications:  Current Outpatient Prescriptions on File Prior to Visit  Medication Sig Dispense Refill  . baclofen (LIORESAL) 10 MG tablet AS NEEDED  3  . estradiol (MINIVELLE) 0.05 MG/24HR patch Place patch on skin and change twice per week 8 patch 3  . progesterone (PROMETRIUM) 100 MG capsule TAKE 1 CAPSULE (100 MG TOTAL) BY MOUTH DAILY. 30 capsule 4  . cholecalciferol (VITAMIN D) 1000 UNITS tablet Take 1,000 Units by mouth daily.    . fish oil-omega-3 fatty acids 1000 MG capsule Take 2 g by mouth daily.    . magnesium oxide (MAG-OX) 400 MG tablet Take 200 mg by mouth daily.     . vitamin B-12 (CYANOCOBALAMIN) 100 MCG tablet Take 100 mcg by mouth daily.     No current facility-administered medications on file prior to visit.    Allergies:  Allergies  Allergen Reactions  . Penicillins Hypertension     Review of Systems:  CONSTITUTIONAL: No fevers, chills, night sweats, or weight loss.   EYES: No visual changes or eye pain ENT: No hearing changes.  No history of nose bleeds.   RESPIRATORY: No cough, wheezing and shortness of breath.   CARDIOVASCULAR: Negative for chest pain, and palpitations.   GI: Negative for abdominal discomfort, blood in stools or black stools.  No recent change in bowel habits.   GU:  No history of incontinence.   MUSCLOSKELETAL: + history of joint pain or swelling.  No myalgias.   SKIN: Negative for lesions, rash, and itching.   ENDOCRINE: Negative for cold or heat intolerance, polydipsia or goiter.   PSYCH:  No depression or anxiety symptoms.   NEURO: As Above.   Vital Signs:  BP 112/74 mmHg  Pulse 80  Ht 5\' 5"  (1.651 m)  Wt 157 lb (71.215 kg)  BMI 26.13 kg/m2  SpO2 98%  LMP 06/03/2013  Neurological Exam: MENTAL STATUS including orientation to time, place,  person, recent and remote memory, attention span and concentration, language, and fund is knowledge is normal.  Speech is not dysarthric. Montreal Cognitive Assessment  05/30/2015  Visuospatial/ Executive (0/5) 5  Naming (0/3) 3  Attention: Read list of digits (0/2) 1  Attention: Read list of letters (0/1) 1  Attention: Serial 7 subtraction starting at 100 (0/3) 3  Language: Repeat phrase (0/2) 2  Language : Fluency (0/1) 1  Abstraction (0/2) 2  Delayed Recall (0/5) 4  Orientation (0/6) 5  Total 27   CRANIAL NERVES:  Pupils equal round and reactive to light.  Normal conjugate, extra-ocular eye movements in all directions of gaze. No nystagmus.  Face is symmetric.   MOTOR:  Motor strength is 5/5 in all extremities.    MSRs:  Reflexes are 3+/4 in throughout, except 2+/4 Achilles bilaterally.   COORDINATION/GAIT:   Gait narrow based and stable.  Tandem gait intact.    Data: Labs 05/17/2015:  HbA1c 5.2, TSH 3.92  MRA brain 04/30/2005: negative   MRI of the brain 04/30/2005: unremarkable  MRI brain wwo contrast 03/03/2015:  Small hyperintensities frontal white matter. At least 1 of these is unchanged from 2006. These are most likely due to chronic microvascular ischemia or migraine headache. Demyelinating disease considered less likely.  MRI lumbar spine 02/10/2013: Grade I anterolisthesis at L4-5 with moderate facet hypertrophy and some uncovering of the disc. No focal stenosis is evident in the supine position  EMG of the lower extremities 03/15/2015:  Normal study  IMPRESSION/PLAN: 1.  Low back pain with left radicular symptoms, likely sciatica and right foot paresthesias - stable  - MRI lumbar spine does not show any nerve impingement, EMG of the lower extremities is normal  - Previously tried:  Lyrica (lightheadedness), neurontin  - Continue nortriptyline 10mg  which is helping  - Continue baclofen 10mg  prn muscle spasms  2.  Mild cognitive impairment, likely stress/anxiety related,  moreso than early dementia  (MOCA 27/30)  - Check vitamin B12 and vitamin E  - If symptoms worsen, neuropsychology testing  - Encouraged to engage in cognitively stimulating activities, maintain good sleep and eating hygiene, and start exercise regimen  3.  Dizziness/lightheadedness, likely vestibular  - MRI brain reviewed with patient and is unremarkable  - She will f/u with her ENT provider  Return to clinic as needed     The duration of this appointment visit was 30 minutes of face-to-face time with the patient.  Greater than 50% of this time was spent in counseling, explanation of diagnosis, planning of further management, and coordination of care.   Thank you for allowing me to participate in patient's care.  If I can answer any additional questions, I would be pleased to do so.    Sincerely,    Donika K. Posey Pronto, DO

## 2015-05-30 NOTE — Patient Instructions (Addendum)
1.  Check blood work 2.  Continue nortriptyline 10mg  at bedtime 3.  Return to clinic as needed

## 2015-06-02 LAB — VITAMIN E
Gamma-Tocopherol (Vit E): 1.6 mg/L (ref <=4.3)
Vitamin E (Alpha Tocopherol): 12.9 mg/L (ref 5.7–19.9)

## 2015-11-10 ENCOUNTER — Encounter: Payer: Self-pay | Admitting: Genetic Counselor

## 2015-11-10 ENCOUNTER — Telehealth: Payer: Self-pay | Admitting: Genetic Counselor

## 2015-11-10 NOTE — Telephone Encounter (Signed)
Verified insurance and address, faxed referring office, mailed new pt packet, scheduled intake

## 2015-12-20 ENCOUNTER — Ambulatory Visit (HOSPITAL_BASED_OUTPATIENT_CLINIC_OR_DEPARTMENT_OTHER): Payer: 59 | Admitting: Genetic Counselor

## 2015-12-20 ENCOUNTER — Other Ambulatory Visit: Payer: 59

## 2015-12-20 DIAGNOSIS — Z808 Family history of malignant neoplasm of other organs or systems: Secondary | ICD-10-CM

## 2015-12-20 DIAGNOSIS — Z803 Family history of malignant neoplasm of breast: Secondary | ICD-10-CM

## 2015-12-20 DIAGNOSIS — Z315 Encounter for genetic counseling: Secondary | ICD-10-CM | POA: Diagnosis not present

## 2015-12-20 DIAGNOSIS — Z85828 Personal history of other malignant neoplasm of skin: Secondary | ICD-10-CM

## 2015-12-21 ENCOUNTER — Encounter: Payer: Self-pay | Admitting: Genetic Counselor

## 2015-12-21 DIAGNOSIS — Z85828 Personal history of other malignant neoplasm of skin: Secondary | ICD-10-CM | POA: Insufficient documentation

## 2015-12-21 NOTE — Progress Notes (Signed)
REFERRING PROVIDER: Molli Posey, MD  PRIMARY PROVIDER:  Kelton Pillar, MD  PRIMARY REASON FOR VISIT:  1. FH: breast cancer in first degree relative   2. History of basal cell carcinoma of skin   3. Family history of malignant melanoma   4. Family history of basal cell carcinoma      HISTORY OF PRESENT ILLNESS:   Ms. Buttrey, a 55 y.o. female, was seen for a [place] cancer genetics consultation at the request of Dr. Matthew Saras due to a family history of breast cancer in her mother and sister.  Ms. Kneeland presents to clinic today to discuss the possibility of a hereditary predisposition to cancer, genetic testing, and to further clarify her future cancer risks, as well as potential cancer risks for family members.   In 2016, at the age of 2, Ms. Lekas was diagnosed with basal cell carcinoma skin cancer. She has no further history of cancer.  HORMONAL RISK FACTORS:  Menarche was at age 77-11.  First live birth at age 27.  OCP use for approximately 10 years.  Ovaries intact: yes.  Hysterectomy: no.  Menopausal status: postmenopausal.  HRT use: 2-3 years. Colonoscopy: yes; normal. Mammogram within the last year: yes. Number of breast biopsies: 0. Up to date with pelvic exams:  yes. Any excessive radiation exposure in the past:  Reports history of multiple MRIs and a couple CT scans; also reports a history of secondhand smoke exposure  Past Medical History  Diagnosis Date  . Thyroid disease     Previously on medications, now off    Past Surgical History  Procedure Laterality Date  . Cesarean section      Social History   Social History  . Marital Status: Married    Spouse Name: N/A  . Number of Children: N/A  . Years of Education: N/A   Social History Main Topics  . Smoking status: Never Smoker   . Smokeless tobacco: Never Used  . Alcohol Use: 0.0 oz/week    1-2 Glasses of wine per week     Comment: Drinks wine about 5 glasses per week  . Drug Use:  No  . Sexual Activity: Yes   Other Topics Concern  . Not on file   Social History Narrative   She works as a Music therapist   She lives with husband.  They have two children.     FAMILY HISTORY:  We obtained a detailed, 4-generation family history.  Significant diagnoses are listed below: Family History  Problem Relation Age of Onset  . Kidney disease Mother   . Breast cancer Mother 80    s/p lump and radiation  . Hyperlipidemia Father   . Basal cell carcinoma Father     dx. unspecified age  . Colon polyps Father     unspecified number  . Melanoma Paternal Uncle     d. mid-70s  . Stroke Maternal Grandmother 83  . Other Maternal Grandfather     blood disorder with immune system issues - NOT leukemia; d. 78s  . Stroke Paternal Grandmother   . Heart attack Paternal Grandfather 93  . Breast cancer Sister 52    s/p lump and radiation  . Other Other 36    niece - hx biopsy and benign breast lumpectomy  . Other Son     hx fatty cyst removed from under arm    Ms. Dorame has one daughter who is currently 75 and a son who is 83.  She has two full  sisters and one full brother, ages 22-68.  One sister was recently diagnosed with breast cancer at the age of 43 and underwent a lumpectomy and radiation.  This sister has two daughters, one of whom has had a benign breast lumpectomy.  Ms. Alcantar mother was diagnosed with breast cancer at 45 and underwent a lumpectomy and radiation.  She passed of congestive heart failure at 1.  Ms. Hamid father died of pancreatitis at the age of 19.  He also had a history of basal cell carcinoma and colon polyps (unpecified number).    Ms. Rorke mother was an only child.  Ms. Tahir maternal grandmother died of a stroke at 5.  Her maternal grandfather died in his 28s of a blood disorder with immune system issues (NOT leukemia), but Ms. Tippets was not certain of his actual diagnosis.  She has some limited information for maternal  great aunts/uncles and great grandparents, but is unaware of any additional cancer history.  Ms. Gendron father had one full brother who died of melanoma in his mid-54s.  He had one son who has not had cancer.  Ms. Wandel paternal grandmother died of a stroke at age 48.  Her grandfather died of a heart attack at 16.  She has no further information for any paternal great aunts/uncles or great grandparents.    Patient's maternal ancestors are of [place] and [place] descent, and paternal ancestors are of [place], [place], [place], and Native American descent. There is no reported Ashkenazi Jewish ancestry. There is no known consanguinity.  GENETIC COUNSELING ASSESSMENT: MALAN WERK is a 55 y.o. female with a family history of breast cancer in 2 first degree relatives. We, therefore, discussed and recommended the following at today's visit.   DISCUSSION: We reviewed the characteristics, features and inheritance patterns of hereditary cancer syndromes, particularly those caused by mutations within the BRCA1/2 genes. We discussed that only about 5-10% of cancer is caused by a genetic mutation, and that for many of these cases we can pick up on certain "red flags" that can tip [place] off to a genetic cause.  These red flags include significant family history of cancer, including multiple relatives diagnosed and relatives in multiple generations diagnosed with cancer.  Red flags also include cancer diagnosed at younger ages and presence of certain rarer cancers, etc.    We discussed that Ms. Emmons does not meet her insurance criteria for coverage of genetic testing.  However we also discussed that her family is much smaller and that sometimes this can give [place] less risk information.  Ms. Setterlund is still interested in genetic testing.  Thus, we also discussed a self-pay genetic testing option through [facility].  We also discussed the process of genetic testing, including the  appropriate family members to test, the process of testing, cost of testing, and turn-around-time for results. We discussed the implications of a negative, positive and/or variant of uncertain significant result. We recommended Ms. Josephs pursue genetic testing for the 30-gene Hereditary Cancer Panel through [facility].  The 30-gene Panel through [facility] [place], [place]) includes sequencing and/or deletion/duplication analysis of the following genes: APC, ATM, BAP1, BARD1, BMPR1A, BRCA1, BRCA2, BRIP1, CDH1, CDK4, CDKN2A, CHEK2, EPCAM, GREM1, MITF, MLH1, MSH2, MSH6, MUTYH, NBN, PALB2, PMS2, POLD1, POLE, PTEN, RAD51C, RAD51D, SMAD4, STK11, and TP53.   Based on Ms. Quito's personal and family history of cancer, she does not meet medical criteria for genetic testing, but genetic testing is still available to her through  laboratories that offer up-front, self-pay genetic testing options. Testing through [facility] is discounted for the month of May, so Ms. Weathersby's out-of-pocket cost is approximately $109.  Ms. Rottman provided consent for testing and a saliva sample was sent to [facility].    Based on the patient's personal and family history, statistical models (Goodwell and IBIS/Tyrer-Cuzick)  and literature data were used to estimate her risk of developing breast cancer. These estimate her lifetime risk of developing breast cancer to be approximately 28.2% to 34.5%. This estimation does not take into account any genetic testing results.  The patient's lifetime breast cancer risk is a preliminary estimate based on available information using one of several models endorsed by the [place] (ACS). The ACS recommends consideration of breast MRI screening as an adjunct to mammography for patients at high risk (defined as 20% or greater lifetime risk). A more detailed breast cancer risk assessment can be considered, if clinically  indicated.   PLAN: After considering the risks, benefits, and limitations, Ms. Rymer  provided informed consent to pursue genetic testing and the blood sample was sent to [facility] for analysis of the 30-gene Hereditary Cancer Panel. Results should be available within approximately 3-4 weeks' time, at which point they will be disclosed by telephone to Ms. Cubero, as will any additional recommendations warranted by these results. Ms. Laguardia will receive a summary of her genetic counseling visit and a copy of her results once available. This information will also be available in Epic. We encouraged Ms. Spittler to remain in contact with cancer genetics annually so that we can continuously update the family history and inform her of any changes in cancer genetics and testing that may be of benefit for her family. Ms. Bickhart questions were answered to her satisfaction today. Our contact information was provided should additional questions or concerns arise.  Thank you for the referral and allowing [place] to share in the care of your patient.   Jeanine Luz, MS, [facility] Certified Genetic Counselor [email or web address] Phone: [phone]  The patient was seen for a total of 60 minutes in face-to-face genetic counseling.  This patient was discussed with Drs. Magrinat, Lindi Adie and/or Burr Medico who agrees with the above.    _______________________________________________________________________ For Office Staff:  Number of people involved in session: 1 Was an Intern/ student involved with case: no

## 2016-02-08 ENCOUNTER — Telehealth: Payer: Self-pay | Admitting: Genetic Counselor

## 2016-02-09 NOTE — Telephone Encounter (Signed)
Discussed that testing was negative for known pathogenic mutations within any of 30 genes on the Hereditary Cancer Panel through [facility].  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  Reviewed that we treat this just like a negative result and reviewed why we do that.  Discussed risk model generated lifetime breast cancer risk of 28-34% and how hormones as well as family history can impact that risk.  Holly Love will discuss whether or not to continue with hormones with her doctor today.  Her niece just had genetic test recently too, so she will follow-up with her to find out more about what her result was.  She will then call me back.

## 2016-02-10 ENCOUNTER — Ambulatory Visit: Payer: Self-pay | Admitting: Genetic Counselor

## 2016-02-10 DIAGNOSIS — Z1379 Encounter for other screening for genetic and chromosomal anomalies: Secondary | ICD-10-CM

## 2016-02-10 DIAGNOSIS — Z85828 Personal history of other malignant neoplasm of skin: Secondary | ICD-10-CM

## 2016-02-10 DIAGNOSIS — Z803 Family history of malignant neoplasm of breast: Secondary | ICD-10-CM

## 2016-03-01 ENCOUNTER — Ambulatory Visit: Payer: 59 | Attending: Otolaryngology | Admitting: Rehabilitative and Restorative Service Providers"

## 2016-03-01 DIAGNOSIS — R2689 Other abnormalities of gait and mobility: Secondary | ICD-10-CM

## 2016-03-01 DIAGNOSIS — R42 Dizziness and giddiness: Secondary | ICD-10-CM | POA: Diagnosis present

## 2016-03-01 NOTE — Patient Instructions (Signed)
Tip Card 1.The goal of habituation training is to assist in decreasing symptoms of vertigo, dizziness, or nausea provoked by specific head and body motions. 2.These exercises may initially increase symptoms; however, be persistent and work through symptoms. With repetition and time, the exercises will assist in reducing or eliminating symptoms. 3.Exercises should be stopped and discussed with the therapist if you experience any of the following: - Sudden change or fluctuation in hearing - New onset of ringing in the ears, or increase in current intensity - Any fluid discharge from the ear - Severe pain in neck or back - Extreme nausea  Copyright  VHI. All rights reserved.   Sit to Side-Lying   Sit on edge of bed. Lie down onto the right side and hold until dizziness stops, plus 20 seconds.  Return to sitting and wait until dizziness stops, plus 20 seconds.  Repeat to the left side. Repeat sequence 5 times per session. Do 1-2 sessions per day.  Copyright  VHI. All rights reserved.  Gaze Stabilization: Tip Card 1.Target must remain in focus, not blurry, and appear stationary while head is in motion. 2.Perform exercises with small head movements (45 to either side of midline). 3.Increase speed of head motion so long as target is in focus. 4.If you wear eyeglasses, be sure you can see target through lens (therapist will give specific instructions for bifocal / progressive lenses). 5.These exercises may provoke dizziness or nausea. Work through these symptoms. If too dizzy, slow head movement slightly. Rest between each exercise. 6.Exercises demand concentration; avoid distractions. 7.For safety, perform standing exercises close to a counter, wall, corner, or next to someone.  Copyright  VHI. All rights reserved.  Gaze Stabilization: Standing Feet Apart   Feet shoulder width apart, keeping eyes on target on wall 3 feet away, tilt head down slightly and move head side to side for 30  seconds.  Do 3 sessions per day.   Copyright  VHI. All rights reserved.   Feet Together (Compliant Surface) Varied Arm Positions - Eyes Closed    Stand on compliant surface: _pillow__ with feet together and arms out. Close eyes and visualize upright position. Hold__30__ seconds. Repeat __3__ times per session. Do _1-2___ sessions per day.  Copyright  VHI. All rights reserved.

## 2016-03-02 NOTE — Therapy (Signed)
[facility] [address] [place], [place], [address] Phone: [phone]   Fax:  [phone]  Physical Therapy Evaluation  Patient Details  Name: Holly Love MRN: [id number] Date of Birth: [birth year] Referring Provider: Mammie Lorenzo, MD  Encounter Date: 03/01/2016      PT End of Session - 03/01/16 1057    Visit Number 1   Number of Visits 5  eval + 4 visits   Date for PT Re-Evaluation 05/01/16   Authorization Type Private insurance/ 30 visit limit   PT Start Time 1100   PT Stop Time 1145   PT Time Calculation (min) 45 min   Activity Tolerance Patient tolerated treatment well   Behavior During Therapy [facility] for tasks assessed/performed      Past Medical History  Diagnosis Date  . Thyroid disease     Previously on medications, now off    Past Surgical History  Procedure Laterality Date  . Cesarean section      There were no vitals filed for this visit.       Subjective Assessment - 03/01/16 1058    Subjective The patient presents to outpatient physical therapy today with history of vertigo that began in 2014  after hitting her head on a kayak when on vacation.  She reports that dizziness began after ending the cruise and removing the scopolamine patch.  Symptoms have improved considerably since initial onset.  She has occasional vertigo with room spinning with rolling in bed (which is rare), her day to day sensation is feeling "wobbly in my space".  The patient feels that these symptoms are worse when her head is moving.  Reading is also a challenge after this situation reporting focusing is harder.  She denies headaches or other visual changes associated with injury- she does have h/o migraines that resolved with menopausal changes.    Patient Stated Goals "I would love to be able to move my head without getting off balance.", Return to gym routine.   Currently in Pain? No/denies            [facility] PT  Assessment - 03/01/16 1057    Assessment   Medical Diagnosis R vestibular hypofunction   Referring Provider Mammie Lorenzo, MD   Prior Therapy prior vestibular rehabilitation   Precautions   Precautions None   Restrictions   Weight Bearing Restrictions No   Home Environment   Living Environment Private residence   Type of [place] to enter   Home Layout Two level   Additional Comments Had a fall last year with fx of arm on steps.   Prior Function   Level of Independence Independent   Vocation Full time employment   Vocation Requirements --  Music therapist   Leisure c/o difficulty in overstimulating environments such as grocery shopping.   Observation/Other Assessments   Focus on Therapeutic Outcomes (FOTO)  78%   Dizziness Handicap Inventory (DHI)  34%   ROM / Strength   AROM / PROM / Strength AROM   AROM   Overall AROM Comments Neck A/ROM WFLs without discomfort.            Vestibular Assessment - 03/01/16 1115    Vestibular Assessment   General Observation Walks independently without device into clinic.   Symptom Behavior   Type of Dizziness Imbalance  visual blurring   Frequency of Dizziness daily   Duration of Dizziness intermittent and episodic   Aggravating Factors Turning head  quickly   Relieving Factors Head stationary   Occulomotor Exam   Occulomotor Alignment Abnormal  R eye inferior to L eye placement   Spontaneous Absent   Gaze-induced Absent   Smooth Pursuits Intact   Saccades Intact   Vestibulo-Occular Reflex   VOR 1 Head Only (x 1 viewing) at self regulated pace x 5 reps patient rates symptoms 2-3/10   Comment Head impulse test is positive to the R side   Visual Acuity   Dynamic 4 line difference in SVA vs DVA indicating abnormal VOR   Positional Testing   Sidelying Test Sidelying Right;Sidelying Left   Horizontal Canal Testing Horizontal Canal Right;Horizontal Canal Left   Sidelying Right   Sidelying Right Duration  none   Sidelying Right Symptoms No nystagmus   Sidelying Left   Sidelying Left Duration none   Sidelying Left Symptoms No nystagmus   Horizontal Canal Right   Horizontal Canal Right Duration none   Horizontal Canal Right Symptoms Normal   Horizontal Canal Left   Horizontal Canal Left Duration none   Horizontal Canal Left Symptoms Normal   Equities trader Comment Patient scores 67% compared to age/height normative data of 72%.  She has WNLs use of somatosensory feedback, mildly dec'd use of visual feedback, and moderately reduced use of vestibular feedback for senosry analysis.  She demonstrated difficulty with first repetition of condition 5 and condition 6, however shows quick learning curve.                 Vestibular Treatment/Exercise - 03/01/16 1147    Vestibular Treatment/Exercise   Vestibular Treatment Provided Habituation;Gaze   Habituation Exercises Nestor Lewandowsky   Gaze Exercises X1 Viewing Horizontal   Nestor Lewandowsky   Number of Reps  2   Symptom Description  mild sensation of dizziness with return to sitting   X1 Viewing Horizontal   Foot Position standing feet apart   Comments 30 seconds horizontal, does not provoke symptoms in vertical plane               PT Education - 03/02/16 0943    Education provided Yes   Education Details HEP: habituation brandt daroff due to motion sensitivity with return to sitting, gaze adaptation in standing, pillow with feet together + eyes closed   Person(s) Educated Patient   Methods Explanation;Demonstration;Handout   Comprehension Verbalized understanding;Returned demonstration          PT Short Term Goals - 03/02/16 0944    PT SHORT TERM GOAL #1   Title The patient will be indep with HEP for gaze adaptation x 1 viewing, habituation and high level balance.   Baseline Target date 04/01/2016   Time 4   Period Weeks   PT SHORT TERM GOAL #2   Title The  patient will improve sensory organization testing from 67% composite equilibrium score to > or equal to 72% to demo improved use of visual and vestibular inputs for balance.   Baseline Target date 04/01/2016   Time 4   Period Weeks   PT SHORT TERM GOAL #3   Title Thea patient will perform gaze x 1 adaptation x 30 seconds with dizziness < or equal to 2/10.   Baseline Target date 04/01/2016   Time 4   Period Weeks           PT Long Term Goals - 03/02/16 0958    PT LONG TERM GOAL #1   Title The patient will  improve dizziness handicap index to < or equal to 18% (from baseline of 34%).   Baseline Target date 05/02/2016   Time 8   Period Weeks   PT LONG TERM GOAL #2   Title The patient will improve static vs dynamic visual acuity from 4 line difference to < or equal to 2 line difference demonstrating improved gain of VOR.   Baseline Target date 05/02/2016   Time 8   Period Weeks   PT LONG TERM GOAL #3   Title The patient will tolerate 60 seconds of gaze adaptation with 0/10 dizziness.   Baseline Target date 05/02/2016   Time 8   Period Weeks   PT LONG TERM GOAL #4   Title The patient will be indep with progression of HEP for dynamic gait, gaze, high level balance and ability to tolerate busy environments.    Baseline Target date 05/02/2016   Time 8   Period Weeks               Plan - 03/02/16 1000    Clinical Impression Statement The patient is a 55 year old female presenting to outpatient physical therapy with onset of vertigo, imbalance s/p head trauma without loss of consciousness in 2014.  She was treated in the past by PT in [place] for cervicogenic dizziness without resolution of symptoms.  At today's visit, her subjective reports and objective assessment reveal diminished use of vestiublar ocular reflex per positive R head thrust test, abnormal static vs dynamic visual acuity.  She also demonstrated dec'd multi-sensory balance use with mild decrease in sensory  organization test score as compared to age/heigh norms.  The patient also demonstrates motion sensitivity per subjective dizziness noted with quick head motion and 3/10 symptoms with VOR.  In addition to these findings, the patient's progress may be impacted by h/o migraines (no increase since head trauma), h/o intermittent BPPV (as described by patient), and other intermittent vertigo prior to injury.     Rehab Potential Good   PT Frequency 1x / week  requesting eval + 4 visits, distributed over 8 weeks   PT Duration 8 weeks   PT Treatment/Interventions ADLs/Self Care Home Management;Patient/family education;Neuromuscular re-education;Gait training   PT Next Visit Plan Check HEP, progress gaze, progress habituation   Consulted and Agree with Plan of Care Patient      Patient will benefit from skilled therapeutic intervention in order to improve the following deficits and impairments:  Abnormal gait, Decreased balance, Dizziness, Difficulty walking  Visit Diagnosis: Dizziness and giddiness  Other abnormalities of gait and mobility     Problem List Patient Active Problem List   Diagnosis Date Noted  . History of basal cell carcinoma of skin 12/21/2015  . Elevated TSH 06/16/2013  . Hot flushes, perimenopausal 01/06/2013  . FH: breast cancer in first degree relative 01/06/2013  . Forgetfulness 01/06/2013  . Essential hypertension, benign 12/17/2012  . Subclinical hypothyroidism 12/11/2012  . Gastroenteritis 08/26/2012  . Hyperlipidemia 07/31/2012  . Elevated blood pressure 07/20/2012  . Anxiety 07/20/2012  . DJD (degenerative joint disease) 07/20/2012  . Perimenopause 07/20/2012  . Osteopenia 07/20/2012  . Hypothyroidism 07/20/2012  . MUSCLE SPASM, TRAPEZIUS 07/26/2009  . KNEE PAIN, BILATERAL 06/16/2009    Holly Love, PT 03/02/2016, 10:05 AM  [facility] [address] [place], [place], [address] Phone:  [phone]   Fax:  [phone]  Name: Holly Love MRN: [id number] Date of Birth: January 31, 1961

## 2016-03-15 DIAGNOSIS — K219 Gastro-esophageal reflux disease without esophagitis: Secondary | ICD-10-CM | POA: Insufficient documentation

## 2016-03-15 DIAGNOSIS — M519 Unspecified thoracic, thoracolumbar and lumbosacral intervertebral disc disorder: Secondary | ICD-10-CM | POA: Insufficient documentation

## 2016-03-19 ENCOUNTER — Ambulatory Visit: Payer: 59 | Attending: Otolaryngology | Admitting: Rehabilitative and Restorative Service Providers"

## 2016-03-19 DIAGNOSIS — R2689 Other abnormalities of gait and mobility: Secondary | ICD-10-CM | POA: Diagnosis present

## 2016-03-19 DIAGNOSIS — R42 Dizziness and giddiness: Secondary | ICD-10-CM

## 2016-03-19 NOTE — Patient Instructions (Signed)
Gaze Stabilization: Tip Card 1.Target must remain in focus, not blurry, and appear stationary while head is in motion. 2.Perform exercises with small head movements (45 to either side of midline). 3.Increase speed of head motion so long as target is in focus. 4.If you wear eyeglasses, be sure you can see target through lens (therapist will give specific instructions for bifocal / progressive lenses). 5.These exercises may provoke dizziness or nausea. Work through these symptoms. If too dizzy, slow head movement slightly. Rest between each exercise. 6.Exercises demand concentration; avoid distractions. 7.For safety, perform standing exercises close to a counter, wall, corner, or next to someone.  Copyright  VHI. All rights reserved.  Gaze Stabilization: Standing Feet Apart   Feet shoulder width apart, keeping eyes on target on wall 3 feet away, tilt head down slightly and move head side to side for 60 seconds. Repeat while moving head up and down for 60 seconds (*start at 30 seconds and when tolerable, begin to add more time). Do 3 sessions per day.   Copyright  VHI. All rights reserved.   Visuo-Vestibular: Head / Eyes Moving in Opposite Direction    Holding a target, keep eyes on target and slowly move target side to side while moving head in OPPOSITE direction of target for __3__ seconds. Perform sitting. Repeat _10___ times per session. Do __1-2__ sessions per day.  Copyright  VHI. All rights reserved.  Side to Side Head Motion While Multi-Tasking    Perform without assistive device. While saying alphabet out loud, walk on solid surface, turn head and eyes to left for __4__ steps. Then, turn head and eyes to opposite side for _4___ steps. Repeat sequence __10__ times per session. Do __1__ sessions per day.  Copyright  VHI. All rights reserved.   Feet Partial Heel-Toe (Compliant Surface) Varied Arm Positions - Eyes Closed    Stand on compliant surface: _pillow__ with  right foot partially in front of the other and arms out. Close eyes and visualize upright position. Hold__10__ seconds. Switch feet and repeat. Repeat _3___ times per session. Do __1__ sessions per day.  Copyright  VHI. All rights reserved.

## 2016-03-19 NOTE — Therapy (Signed)
[facility] [address] [place], [place], [address] Phone: [phone]   Fax:  [phone]  Physical Therapy Treatment  Patient Details  Name: Holly Love MRN: [id number] Date of Birth: [birth year] Referring Provider: Mammie Lorenzo, MD  Encounter Date: 03/19/2016      PT End of Session - 03/19/16 1412    Visit Number 2   Number of Visits 5  eval + 4 visits   Date for PT Re-Evaluation 05/01/16   Authorization Type Private insurance/ 30 visit limit   PT Start Time 0940   PT Stop Time 1015   PT Time Calculation (min) 35 min   Activity Tolerance Patient tolerated treatment well   Behavior During Therapy [facility] for tasks assessed/performed      Past Medical History:  Diagnosis Date  . Thyroid disease    Previously on medications, now off    Past Surgical History:  Procedure Laterality Date  . CESAREAN SECTION      There were no vitals filed for this visit.      Subjective Assessment - 03/19/16 0943    Subjective The patient feels that exercises are going well.  She [place], she had worse symptoms.     Patient Stated Goals "I would love to be able to move my head without getting off balance.", Return to gym routine.   Currently in Pain? No/denies           [facility] Adult PT Treatment/Exercise - 03/19/16 1419      Ambulation/Gait   Ambulation/Gait Yes   Gait Comments Dynamic gait with horizontal head motion and vertical head motion.  Provided HEP to address.          Vestibular Treatment/Exercise - 03/19/16 1413      Vestibular Treatment/Exercise   Vestibular Treatment Provided Habituation;Gaze   Habituation Exercises Standing Horizontal Head Turns;Standing Vertical Head Turns;Comment   Gaze Exercises X1 Viewing Horizontal;X1 Viewing Vertical;X2 Viewing Horizontal     Nestor Lewandowsky   Symptom Description  Discharged brandt daroff as patient reports this is no  longer challenging- does not provoke symptoms.     Standing Horizontal Head Turns   Number of Reps  5   Symptom Description  in corner on foam with horizontal head motion     Standing Vertical Head Turns   Number of Reps  5   Symptom Description  in corner on foam     X1 Viewing Horizontal   Foot Position standing feet apart   Comments 30 seconds horizontal recommending progress to 60 seconds; patient reports able to maintain fixation but does still note mild retinal slip described as slight jumping of target     X1 Viewing Vertical   Foot Position standing feet apart   Comments 30 seconds with cues on larger ROM     X2 Viewing Horizontal   Foot Position seated    Comments performed to begin to work on head motion in busy environment (ie. grocery store)               PT Education - 03/19/16 1411    Education provided Yes   Education Details Reviewed gaze x 1 horizontal/vertical standing, x 2 viewing seated, compliant with partial heel/toe and eyes closed, gait with horiozntal head motion.   Person(s) Educated Patient   Methods Explanation;Demonstration;Handout   Comprehension Verbalized understanding;Returned demonstration          PT Short Term Goals - 03/19/16  [place] #1   Title The patient will be indep with HEP for gaze adaptation x 1 viewing, habituation and high level balance.   Baseline Met on 03/19/2016   Time 4   Period Weeks   Status Achieved     PT SHORT TERM GOAL #2   Title The patient will improve sensory organization testing from 67% composite equilibrium score to > or equal to 72% to demo improved use of visual and vestibular inputs for balance.   Baseline Target date 04/01/2016   Time 4   Period Weeks   Status On-going     PT SHORT TERM GOAL #3   Title Thea patient will perform gaze x 1 adaptation x 30 seconds with dizziness < or equal to 2/10.   Baseline Target date 04/01/2016   Time 4   Period Weeks   Status On-going            PT Long Term Goals - 03/02/16 0958      PT LONG TERM GOAL #1   Title The patient will improve dizziness handicap index to < or equal to 18% (from baseline of 34%).   Baseline Target date 05/02/2016   Time 8   Period Weeks     PT LONG TERM GOAL #2   Title The patient will improve static vs dynamic visual acuity from 4 line difference to < or equal to 2 line difference demonstrating improved gain of VOR.   Baseline Target date 05/02/2016   Time 8   Period Weeks     PT LONG TERM GOAL #3   Title The patient will tolerate 60 seconds of gaze adaptation with 0/10 dizziness.   Baseline Target date 05/02/2016   Time 8   Period Weeks     PT LONG TERM GOAL #4   Title The patient will be indep with progression of HEP for dynamic gait, gaze, high level balance and ability to tolerate busy environments.    Baseline Target date 05/02/2016   Time 8   Period Weeks               Plan - 03/19/16 1413    Clinical Impression Statement The patient is tolerating home program well and PT progressed activities further today.  She c/o grocery store as challenging environment and PT to progress dynamic gait activities in busy environment to improve functional activities in community.    PT Treatment/Interventions ADLs/Self Care Home Management;Patient/family education;Neuromuscular re-education;Gait training   PT Next Visit Plan Check HEP, progress gaze, progress habituation   Consulted and Agree with Plan of Care Patient      Patient will benefit from skilled therapeutic intervention in order to improve the following deficits and impairments:  Abnormal gait, Decreased balance, Dizziness, Difficulty walking  Visit Diagnosis: Dizziness and giddiness  Other abnormalities of gait and mobility     Problem List Patient Active Problem List   Diagnosis Date Noted  . History of basal cell carcinoma of skin 12/21/2015  . Elevated TSH 06/16/2013  . Hot flushes, perimenopausal  01/06/2013  . FH: breast cancer in first degree relative 01/06/2013  . Forgetfulness 01/06/2013  . Essential hypertension, benign 12/17/2012  . Subclinical hypothyroidism 12/11/2012  . Gastroenteritis 08/26/2012  . Hyperlipidemia 07/31/2012  . Elevated blood pressure 07/20/2012  . Anxiety 07/20/2012  . DJD (degenerative joint disease) 07/20/2012  . Perimenopause 07/20/2012  . Osteopenia 07/20/2012  . Hypothyroidism 07/20/2012  . MUSCLE SPASM, TRAPEZIUS 07/26/2009  .  KNEE PAIN, BILATERAL 06/16/2009    Reece Fehnel, PT 03/19/2016, 2:19 PM  [facility] [address] [place], [place], [address] Phone: [phone]   Fax:  [phone]  Name: Holly Love MRN: [id number] Date of Birth: [birth year]

## 2016-04-04 ENCOUNTER — Ambulatory Visit: Payer: 59 | Admitting: Rehabilitative and Restorative Service Providers"

## 2016-04-09 ENCOUNTER — Ambulatory Visit: Payer: 59 | Admitting: Rehabilitative and Restorative Service Providers"

## 2016-04-09 DIAGNOSIS — R42 Dizziness and giddiness: Secondary | ICD-10-CM | POA: Diagnosis not present

## 2016-04-09 DIAGNOSIS — R2689 Other abnormalities of gait and mobility: Secondary | ICD-10-CM

## 2016-04-09 NOTE — Therapy (Signed)
[facility] [address] [place], [place], [address] Phone: [phone]   Fax:  [phone]  Physical Therapy Treatment  Patient Details  Name: Holly Love MRN: [id number] Date of Birth: 1960-08-15 Referring Provider: Mammie Lorenzo, MD  Encounter Date: 04/09/2016      PT End of Session - 04/09/16 0926    Visit Number 3   Number of Visits 5  eval + 4 visits   Date for PT Re-Evaluation 05/01/16   Authorization Type Private insurance/ 30 visit limit   PT Start Time 0850   PT Stop Time 0930   PT Time Calculation (min) 40 min   Equipment Utilized During Treatment --   Activity Tolerance Patient tolerated treatment well   Behavior During Therapy [facility] for tasks assessed/performed      Past Medical History:  Diagnosis Date  . Thyroid disease    Previously on medications, now off    Past Surgical History:  Procedure Laterality Date  . CESAREAN SECTION      There were no vitals filed for this visit.      Subjective Assessment - 04/09/16 0852    Subjective The patient reports she is not able to do HEP as regularly as she hopes.  She is doing HEP and feels that she has double vision at end range of exercise.  She reports that in a car she notes imbalance when looking at her phone (as passenger) once arrives to location.    Patient Stated Goals "I would love to be able to move my head without getting off balance.", Return to gym routine.                         [facility] Adult PT Treatment/Exercise - 04/09/16 0910      Ambulation/Gait   Ambulation/Gait Yes   Gait Comments Gait iwth horizontal head motion x 150 feet with slower pace, but no loss of balance noted.      Neuro Re-ed    Neuro Re-ed Details  Standing exercise in corner with partial heel/toe and eyes closed on compliant surfaces.  Sensory organization testing=72% (improved to WNLs for age/height normative values.          Vestibular Treatment/Exercise - 04/09/16 0901      Vestibular Treatment/Exercise   Vestibular Treatment Provided Habituation;Gaze   Habituation Exercises Seated Horizontal Head Turns     X1 Viewing Horizontal   Foot Position standing feet apart    Comments patient notes trace of visual blur, no further jumping of visual target.  Notes a minimal amt of dizziness when stopping head motion.     X1 Viewing Vertical   Foot Position standing feet apart   Comments 30 seconds without dizziness.     X2 Viewing Horizontal   Foot Position seated    Comments cues to continue with head motion during task.  Patietn noted double with small target at home- recommended larger target to begin.                  PT Short Term Goals - 04/09/16 0914      PT SHORT TERM GOAL #1   Title The patient will be indep with HEP for gaze adaptation x 1 viewing, habituation and high level balance.   Baseline Met on 03/19/2016   Time 4   Period Weeks   Status Achieved     PT SHORT TERM GOAL #2   Title The patient  will improve sensory organization testing from 67% composite equilibrium score to > or equal to 72% to demo improved use of visual and vestibular inputs for balance.   Baseline Met on 04/09/2016- patient scores 72%.   Time 4   Period Weeks   Status Achieved     PT SHORT TERM GOAL #3   Title Thea patient will perform gaze x 1 adaptation x 30 seconds with dizziness < or equal to 2/10.   Baseline Met on 04/09/2016   Time 4   Period Weeks   Status Achieved           PT Long Term Goals - 04/09/16 1703      PT LONG TERM GOAL #1   Title The patient will improve dizziness handicap index to < or equal to 18% (from baseline of 34%).   Baseline Target date 05/02/2016   Time 8   Period Weeks     PT LONG TERM GOAL #2   Title The patient will improve static vs dynamic visual acuity from 4 line difference to < or equal to 2 line difference demonstrating improved gain of VOR.   Baseline Target  date 05/02/2016   Time 8   Period Weeks     PT LONG TERM GOAL #3   Title The patient will tolerate 60 seconds of gaze adaptation with 0/10 dizziness.   Baseline Target date 05/02/2016   Time 8   Period Weeks     PT LONG TERM GOAL #4   Title The patient will be indep with progression of HEP for dynamic gait, gaze, high level balance and ability to tolerate busy environments.    Baseline Target date 05/02/2016   Time 8   Period Weeks               Plan - 04/09/16 1703    Clinical Impression Statement The patient met 3/3 STGs.  PT recommends she continue to work on HEP and f/u in 3-4 weeks to progress current HEP and determine need for further services.   PT Treatment/Interventions ADLs/Self Care Home Management;Patient/family education;Neuromuscular re-education;Gait training   PT Next Visit Plan Check HEP, progress gaze, progress habituation   Consulted and Agree with Plan of Care Patient      Patient will benefit from skilled therapeutic intervention in order to improve the following deficits and impairments:  Abnormal gait, Decreased balance, Dizziness, Difficulty walking  Visit Diagnosis: Dizziness and giddiness  Other abnormalities of gait and mobility     Problem List Patient Active Problem List   Diagnosis Date Noted  . History of basal cell carcinoma of skin 12/21/2015  . Elevated TSH 06/16/2013  . Hot flushes, perimenopausal 01/06/2013  . FH: breast cancer in first degree relative 01/06/2013  . Forgetfulness 01/06/2013  . Essential hypertension, benign 12/17/2012  . Subclinical hypothyroidism 12/11/2012  . Gastroenteritis 08/26/2012  . Hyperlipidemia 07/31/2012  . Elevated blood pressure 07/20/2012  . Anxiety 07/20/2012  . DJD (degenerative joint disease) 07/20/2012  . Perimenopause 07/20/2012  . Osteopenia 07/20/2012  . Hypothyroidism 07/20/2012  . MUSCLE SPASM, TRAPEZIUS 07/26/2009  . KNEE PAIN, BILATERAL 06/16/2009    Holly Love,  PT 04/09/2016, 5:06 PM  [facility] [address] [place], [place], [address] Phone: [phone]   Fax:  [phone]  Name: Holly Love MRN: [id number] Date of Birth: 03/23/1961

## 2016-04-18 ENCOUNTER — Encounter: Payer: 59 | Admitting: Rehabilitative and Restorative Service Providers"

## 2016-04-22 ENCOUNTER — Encounter: Payer: Self-pay | Admitting: Genetic Counselor

## 2016-04-22 DIAGNOSIS — Z1379 Encounter for other screening for genetic and chromosomal anomalies: Secondary | ICD-10-CM | POA: Insufficient documentation

## 2016-04-22 NOTE — Progress Notes (Signed)
GENETIC TEST RESULT  HPI: Ms. Palacios was previously seen in the [facility] clinic due to a family history of breast cancer and melanoma and concerns regarding a hereditary predisposition to cancer. Please refer to our prior cancer genetics clinic note from Dec 20, 2015 for more information regarding Ms. Harper's medical, social and family histories, and our assessment and recommendations, at the time. Ms. Imel recent genetic test results were disclosed to her, as were recommendations warranted by these results. These results and recommendations are discussed in more detail below.  GENETIC TEST RESULTS: At the time of Ms. Prim's visit on 12/20/15, we recommended she pursue genetic testing of the 30-gene Hereditary Cancer Panel through [facility]. The 30-gene Panel through [facility] [place], [place]) includes sequencing and/or deletion/duplication analysis of the following genes: APC, ATM, BAP1, BARD1, BMPR1A, BRCA1, BRCA2, BRIP1, CDH1, CDK4, CDKN2A, CHEK2, EPCAM, GREM1, MITF, MLH1, MSH2, MSH6, MUTYH, NBN, PALB2, PMS2, POLD1, POLE, PTEN, RAD51C, RAD51D, SMAD4, STK11, and TP53.  Those results are now back, the report date for which is Dec 20, 2015.  Genetic testing was normal, and did not reveal a known deleterious mutation in these genes.  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  The test report will be scanned into EPIC and will be located under the Results Review tab in the Pathology>Molecular Pathology section.   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 At this time, it is unknown if this VUS is associated with an increased risk for cancer or if this is a normal finding. Since this VUS result is uncertain, it cannot help guide screening recommendations, and family members should not be tested for this VUS to help define their  own cancer risks.  Also, we all have variants within our genes that make [place] unique individuals--most of these variants are benign.  Thus, we treat this VUS as a negative result.   With time, we suspect the lab will reclassify this variant and when they do, we will try to re-contact Ms. Weltman to discuss the reclassification further.  We also encouraged Ms. Vanepps to contact us in a year or two to obtain an update on the status of this VUS.  We discussed with Ms. Matson that since the current genetic testing is not perfect, it is possible there may be a gene mutation in one of these genes that current testing cannot detect, but that chance is small. We also discussed, that it is possible that another gene that has not yet been discovered, or that we have not yet tested, is responsible for the cancer diagnoses in the family, and it is, therefore, important to remain in touch with cancer genetics in the future so that we can continue to offer Ms. Lekas the most up-to-date genetic testing.   CANCER SCREENING RECOMMENDATIONS: This normal result is reassuring and indicates that Ms. Nottingham does not likely have an increased risk of cancer due to a mutation in one of these genes.  We, therefore, recommended  Ms. Strandberg continue to follow the cancer screening guidelines provided by her primary healthcare providers.  Ms. Chiquito now has two first degree relatives who have had breast cancer.  This raises her perceived lifetime breast cancer risk to approximately 28-34%.  Thus, she is likely eligible for annual breast MRIs, in addition to annual mammograms.  She will discuss this option with her doctor.  RECOMMENDATIONS FOR FAMILY MEMBERS: Women in this family might be at some increased risk of developing breast cancer, over the general population risk, simply due to the family history of breast cancer. We recommended women in this family have a yearly mammogram beginning at age 29, or 26 years younger  than the earliest onset of cancer, an annual clinical breast exam, and perform monthly breast self-exams. Women in this family should also have a gynecological exam as recommended by their primary provider. All family members should have a colonoscopy by age 23.  FOLLOW-UP: Lastly, we discussed with Ms. Jewell that cancer genetics is a rapidly advancing field and it is possible that new genetic tests will be appropriate for her and/or her family members in the future. We encouraged her to remain in contact with cancer genetics on an annual basis so we can update her personal and family histories and let her know of advances in cancer genetics that may benefit this family.   Our contact number was provided. Ms. Trudel questions were answered to her satisfaction, and she knows she is welcome to call us at anytime with additional questions or concerns.   Jeanine Luz, MS, [facility] Certified Genetic Counselor [email or web address] Phone: [phone]

## 2016-05-09 ENCOUNTER — Encounter: Payer: 59 | Admitting: Rehabilitative and Restorative Service Providers"

## 2016-05-30 ENCOUNTER — Ambulatory Visit: Payer: 59 | Attending: Otolaryngology | Admitting: Rehabilitative and Restorative Service Providers"

## 2016-05-30 DIAGNOSIS — R2689 Other abnormalities of gait and mobility: Secondary | ICD-10-CM

## 2016-05-30 DIAGNOSIS — R42 Dizziness and giddiness: Secondary | ICD-10-CM

## 2016-05-30 NOTE — Patient Instructions (Signed)
PROGRESSION OF LETTER EXERCISE:  Gaze Stabilization: Standing Feet Apart    Feet shoulder width apart, keeping eyes on target on wall _3___ feet away, tilt head down slightly and move head side to side for _30-60___ seconds. Repeat while moving head up and down for _30-60__ seconds. Do __2-3__ sessions per day. Repeat using target on pattern background.  Copyright  VHI. All rights reserved.    Visuo-Vestibular: Head / Eyes Moving in Opposite Direction    Holding a target, keep eyes on target and slowly move target side to side while moving head in OPPOSITE direction of target for __3__ seconds. Perform sitting. Repeat __10__ times per session. Do _2___ sessions per day.  Copyright  VHI. All rights reserved.

## 2016-05-30 NOTE — Therapy (Signed)
[facility] [address] [place], [place], [address] Phone: [phone]   Fax:  [phone]  Physical Therapy Treatment and Discharge Summary  Patient Details  Name: Holly Love MRN: [id number] Date of Birth: Dec 15, 1960 Referring Provider: Mammie Lorenzo, MD  Encounter Date: 05/30/2016      PT End of Session - 05/30/16 1945    Visit Number 4   Number of Visits 5  eval + 4 visits   Date for PT Re-Evaluation 05/01/16   Authorization Type Private insurance/ 30 visit limit   PT Start Time 1155   PT Stop Time 1220   PT Time Calculation (min) 25 min   Activity Tolerance Patient tolerated treatment well   Behavior During Therapy [facility] for tasks assessed/performed      Past Medical History:  Diagnosis Date  . Thyroid disease    Previously on medications, now off    Past Surgical History:  Procedure Laterality Date  . CESAREAN SECTION      There were no vitals filed for this visit.      Subjective Assessment - 05/30/16 1155    Subjective The patient feels symptoms are overall improved, however she still has symptoms that appear worse on certain days.    She still notes gaze exercises are challenging.  She notes that she still stumbles intermittently t/o the day.     Patient Stated Goals "I would love to be able to move my head without getting off balance.", Return to gym routine.   Currently in Pain? No/denies            NEUROMUSCULAR RE-EDUCATION: Reviewed gaze x 1 viewing standing with feet apart at 81f distance x 60 seconds with dizziness 1/10. Performed gaze x 2 viewing seated x 10 reps Performed gaze x 1 viewing on busy background and discussed how to do in the home (using wrapping paper or checker board) 360 degree turns R and L sides with pause in between repetitions  SELF CARE/HOME MANAGEMENT: Discussed progression of wellness program and need to increase intensity of activities (in both  amplitude and speed of movement).  Discussed return to gym exercise classes beginning with yoga (currently attending) and working up to low intensity>progressing to higher intensity classes.  Also discussed that overall increase in movement will help her continue to work through dizziness and imbalance (if performed in safe way).  Discussed busy environments and strategies to improve tolerance to activities.       PT Short Term Goals - 04/09/16 0914      PT SHORT TERM GOAL #1   Title The patient will be indep with HEP for gaze adaptation x 1 viewing, habituation and high level balance.   Baseline Met on 03/19/2016   Time 4   Period Weeks   Status Achieved     PT SHORT TERM GOAL #2   Title The patient will improve sensory organization testing from 67% composite equilibrium score to > or equal to 72% to demo improved use of visual and vestibular inputs for balance.   Baseline Met on 04/09/2016- patient scores 72%.   Time 4   Period Weeks   Status Achieved     PT SHORT TERM GOAL #3   Title Thea patient will perform gaze x 1 adaptation x 30 seconds with dizziness < or equal to 2/10.   Baseline Met on 04/09/2016   Time 4   Period Weeks   Status Achieved  PT Long Term Goals - 05/30/16 1201      PT LONG TERM GOAL #1   Title The patient will improve dizziness handicap index to < or equal to 18% (from baseline of 34%).   Baseline Improved to 22% from 34% on 05/30/2016   Time 8   Period Weeks   Status Partially Met     PT LONG TERM GOAL #2   Title The patient will improve static vs dynamic visual acuity from 4 line difference to < or equal to 2 line difference demonstrating improved gain of VOR.   Baseline Remains 4/10 on 05/30/2016   Time 8   Period Weeks   Status Not Met     PT LONG TERM GOAL #3   Title The patient will tolerate 60 seconds of gaze adaptation with 0/10 dizziness.   Baseline Patient tolerates 1 minutes with dizziness 1/10.   Time 8   Period Weeks    Status Partially Met     PT LONG TERM GOAL #4   Title The patient will be indep with progression of HEP for dynamic gait, gaze, high level balance and ability to tolerate busy environments.    Baseline Target date 05/02/2016   Time 8   Period Weeks   Status Achieved               Plan - 05/30/16 1946    Clinical Impression Statement The patient met 3/4 LTGs.  She did not meet LTG to improve static versus dynamic visual acuity.  Barriers to achieving goals include not being able to participate in regular HEP (daughter recently got married), and time during the day to perform HEP.    PT Treatment/Interventions ADLs/Self Care Home Management;Patient/family education;Neuromuscular re-education;Gait training   PT Next Visit Plan D/c visit.   Consulted and Agree with Plan of Care Patient      Patient will benefit from skilled therapeutic intervention in order to improve the following deficits and impairments:  Abnormal gait, Decreased balance, Dizziness, Difficulty walking  Visit Diagnosis: Dizziness and giddiness  Other abnormalities of gait and mobility     Problem List Patient Active Problem List   Diagnosis Date Noted  . Genetic testing 04/22/2016  . History of basal cell carcinoma of skin 12/21/2015  . Elevated TSH 06/16/2013  . Hot flushes, perimenopausal 01/06/2013  . FH: breast cancer in first degree relative 01/06/2013  . Forgetfulness 01/06/2013  . Essential hypertension, benign 12/17/2012  . Subclinical hypothyroidism 12/11/2012  . Gastroenteritis 08/26/2012  . Hyperlipidemia 07/31/2012  . Elevated blood pressure 07/20/2012  . Anxiety 07/20/2012  . DJD (degenerative joint disease) 07/20/2012  . Perimenopause 07/20/2012  . Osteopenia 07/20/2012  . Hypothyroidism 07/20/2012  . MUSCLE SPASM, TRAPEZIUS 07/26/2009  . KNEE PAIN, BILATERAL 06/16/2009  PHYSICAL THERAPY DISCHARGE SUMMARY  Visits from Start of Care: 4  Current functional level related to goals /  functional outcomes: See above   Remaining deficits: Intermittent/variable days with increased dizziness and occasional unsteadiness noted.    Education / Equipment: HEP, progression of wellness program, return to gym routine.  Plan: Patient agrees to discharge.  Patient goals were partially met. Patient is being discharged due to meeting the stated rehab goals.  ?????        Thank you for the referral of this patient. Rudell Cobb, MPT    [place], PT 05/30/2016, 7:49 PM  [facility] [address] [place], [place], [address] Phone: [phone]   Fax:  [phone]  Name: Holly Love MRN: [id number] Date of Birth: 1961/02/20

## 2016-07-07 ENCOUNTER — Other Ambulatory Visit: Payer: Self-pay | Admitting: Neurology

## 2016-07-09 ENCOUNTER — Other Ambulatory Visit: Payer: Self-pay | Admitting: *Deleted

## 2016-07-09 MED ORDER — NORTRIPTYLINE HCL 10 MG PO CAPS: 10.0000 mg | ORAL_CAPSULE | Freq: Every day | ORAL | 0 refills | Status: DC

## 2016-07-09 NOTE — Telephone Encounter (Signed)
PT called and said she needs a prescription refill for Nortriptyline/Dawn CB# [phone]

## 2016-08-02 ENCOUNTER — Encounter: Payer: Self-pay | Admitting: Neurology

## 2016-08-02 ENCOUNTER — Ambulatory Visit (INDEPENDENT_AMBULATORY_CARE_PROVIDER_SITE_OTHER): Payer: 59 | Admitting: Neurology

## 2016-08-02 VITALS — BP 120/70 | HR 82 | Ht 64.0 in | Wt 163.2 lb

## 2016-08-02 DIAGNOSIS — M5442 Lumbago with sciatica, left side: Secondary | ICD-10-CM

## 2016-08-02 DIAGNOSIS — G8929 Other chronic pain: Secondary | ICD-10-CM

## 2016-08-02 MED ORDER — NORTRIPTYLINE HCL 10 MG PO CAPS: 10.0000 mg | ORAL_CAPSULE | Freq: Every day | ORAL | 3 refills | Status: DC

## 2016-08-02 NOTE — Patient Instructions (Signed)
1.  Your refill for nortriptyline 10mg  at bedtime has been sent 2.  Return to clinic as needed

## 2016-08-02 NOTE — Progress Notes (Signed)
Follow-up Visit   Date: 08/02/16     Holly Love MRN: [id number] DOB: 09/18/1960   Interim History: Holly Love is a 55 y.o. right-handed Caucasian female with history of low back pain returning to the clinic for follow-up of low back pain and new complaints of dizziness.  The patient was accompanied to the clinic by self.    History of present illness: In May 2013, she fell and since then she had developed slowly progressive low back pain. In April 2014, she developed shooting pain starting from her buttocks and radiating into her legs, worse on the right. Symptoms are better when she is standing, walking, or laying, but prolonged sitting and standing exacerbates her symptoms. She has associated tingling of the legs. Therapy helped for a brief time and resolved her shooting pain, but she continues to have prickly sensation from her buttocks to her posterior thigh down to her knees. MRI of the lumbar spine showed grade I anterolisthesis at L4-5 with moderate facet hypertrophy without nerve impingement. She had tried heat, ice, and muscle relaxants. Of note, she had seen Dr. Erling Cruz in 2006 for chronic daily headaches.   UPDATE 05/30/2015:  She tried increasing nortriptyline to 20mg  but developed grogginess the following day, so reduced it back down to 10mg  and that seems to be controlling her paresthesias and pain.  She takes baclofen 10mg  for severe muscle spasm and takes it about once every three weeks.  She also complains of problems with short-term recall, such as names and places.  She forgot where she parked in the mall on one occasion. 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  She is still doing all IADLs and ADLs without difficulty.  She has not been involved in any MVAs.   UPDATE 08/02/2016:  She has been doing over the past year and denies any new problems.  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  She is here for refills on nortriptyline 10mg  which controls her low back pain.  No interval hospitalizations, illnesses, or falls. Memory issues are also improved, she continues to have some problems recalls people's names, but does not feel as forgetful as before.   Medications:  Current Outpatient Prescriptions on File Prior to Visit  Medication Sig Dispense Refill  . cholecalciferol (VITAMIN D) 1000 UNITS tablet Take 1,000 Units by mouth daily.    Marland Kitchen estradiol (MINIVELLE) 0.05 MG/24HR patch Place patch on skin and change twice per week 8 patch 3  . fish oil-omega-3 fatty acids 1000 MG capsule Take 2 g by mouth daily.    . magnesium oxide (MAG-OX) 400 MG tablet Take 200 mg by mouth daily.     . metoprolol succinate (TOPROL-XL) 25 MG 24 hr tablet 25 mg. Take 1 tablet daily  1  . nortriptyline (PAMELOR) 10 MG capsule Take 1 capsule (10 mg total) by mouth at bedtime. 90 capsule 0  . progesterone (PROMETRIUM) 100 MG capsule TAKE 1 CAPSULE (100 MG TOTAL) BY MOUTH DAILY. 30 capsule 4  . vitamin B-12 (CYANOCOBALAMIN) 100 MCG tablet Take 100 mcg by mouth daily.     No current facility-administered medications on file prior to visit.     Allergies:  Allergies  Allergen Reactions  . Erythromycin Anaphylaxis  . Penicillins Hypertension  . Albumin (Human) Other (See Comments)    Gastric pain     Review of Systems:  CONSTITUTIONAL: No fevers, chills, night sweats, or weight loss.   EYES: No visual changes or eye pain ENT: No hearing changes.  No history of nose bleeds.   RESPIRATORY: No cough, wheezing and shortness of breath.   CARDIOVASCULAR: Negative for chest pain, and palpitations.   GI: Negative for abdominal discomfort, blood in stools or black stools.  No recent change in bowel habits.   GU:  No history of incontinence.   MUSCLOSKELETAL: No history of joint pain or swelling.  No myalgias.   SKIN: Negative for lesions, rash, and itching.     ENDOCRINE: Negative for cold or heat intolerance, polydipsia or goiter.   PSYCH:  No depression or anxiety symptoms.   NEURO: As Above.   Vital Signs:  BP 120/70   Pulse 82   Ht 5\' 4"  (1.626 m)   Wt 163 lb 3 oz (74 kg)   LMP 06/03/2013   SpO2 99%   BMI 28.01 kg/m   Neurological Exam: MENTAL STATUS including orientation to time, place, person, recent and remote memory, attention span and concentration, language, and fund is knowledge is normal.  Speech is not dysarthric. Montreal Cognitive Assessment  05/30/2015  Visuospatial/ Executive (0/5) 5  Naming (0/3) 3  Attention: Read list of digits (0/2) 1  Attention: Read list of letters (0/1) 1  Attention: Serial 7 subtraction starting at 100 (0/3) 3  Language: Repeat phrase (0/2) 2  Language : Fluency (0/1) 1  Abstraction (0/2) 2  Delayed Recall (0/5) 4  Orientation (0/6) 5  Total 27   CRANIAL NERVES:  Pupils equal round and reactive to light.  Normal conjugate, extra-ocular eye movements in all directions of gaze. No nystagmus.  Face is symmetric.   MOTOR:  Motor strength is 5/5 in all extremities.    MSRs:  Reflexes are 2/4 in throughout.  SENSORY:  Vibration intact throughout.   COORDINATION/GAIT:   Gait narrow based and stable. Tandem gait intact.    Data: Labs 05/17/2015:  HbA1c 5.2, TSH 3.92 Labs 05/30/2015:  Vitamin B12 680, vitamin E 1.6  MRA brain 04/30/2005: negative   MRI of the brain 04/30/2005: unremarkable  MRI brain wwo contrast 03/03/2015:  Small hyperintensities frontal white matter. At least 1 of these is unchanged from 2006. These are most likely due to chronic microvascular ischemia or migraine headache. Demyelinating disease considered less likely.  MRI lumbar spine 02/10/2013: Grade I anterolisthesis at L4-5 with moderate facet hypertrophy and some uncovering of the disc. No focal stenosis is evident in the supine position  EMG of the lower extremities 03/15/2015:  Normal study  IMPRESSION/PLAN: 1.   Low back pain with left radicular symptoms, likely sciatica and right foot paresthesias - stable  - MRI lumbar spine and EMG of the legs is normal  - Previously tried:  Lyrica (lightheadedness), neurontin  - Continue nortriptyline 10mg  at bedtime  - Home exercises recommended  2.  Mild cognitive impairment, likely stress/anxiety related - improved  - If symptoms worsen, neuropsychology testing  3.  Dizziness/lightheadedness, likely vestibular  - MRI brain reviewed with patient and is unremarkable  - She is seeing ENT and exercises  Return to clinic as needed     The duration of this appointment visit was 20 minutes of face-to-face time with the patient.  Greater than 50% of this time was spent in counseling, explanation of diagnosis, planning of further management, and coordination of care.   Thank you for allowing me to participate in patient's care.  If I  can answer any additional questions, I would be pleased to do so.    Sincerely,    Yudit Modesitt K. Posey Pronto, DO

## 2016-11-03 ENCOUNTER — Encounter (HOSPITAL_COMMUNITY): Payer: Self-pay

## 2016-12-06 DIAGNOSIS — Z8249 Family history of ischemic heart disease and other diseases of the circulatory system: Secondary | ICD-10-CM | POA: Insufficient documentation

## 2017-07-17 ENCOUNTER — Other Ambulatory Visit: Payer: Self-pay | Admitting: Neurology

## 2017-07-17 NOTE — Telephone Encounter (Signed)
Rx sent with note stating that patient needs an appointment before anymore refills are given.   

## 2017-09-06 DIAGNOSIS — M7542 Impingement syndrome of left shoulder: Secondary | ICD-10-CM | POA: Insufficient documentation

## 2018-02-18 ENCOUNTER — Other Ambulatory Visit: Payer: Self-pay | Admitting: Neurology

## 2018-04-22 DIAGNOSIS — K5909 Other constipation: Secondary | ICD-10-CM | POA: Insufficient documentation

## 2018-04-29 ENCOUNTER — Other Ambulatory Visit: Payer: Self-pay | Admitting: Obstetrics and Gynecology

## 2018-04-29 DIAGNOSIS — Z803 Family history of malignant neoplasm of breast: Secondary | ICD-10-CM

## 2018-05-14 ENCOUNTER — Ambulatory Visit
Admission: RE | Admit: 2018-05-14 | Discharge: 2018-05-14 | Disposition: A | Discharge status: Home / Self Care | Payer: BLUE CROSS/BLUE SHIELD | Source: Ambulatory Visit | Attending: Obstetrics and Gynecology | Admitting: Obstetrics and Gynecology

## 2018-05-14 DIAGNOSIS — Z803 Family history of malignant neoplasm of breast: Secondary | ICD-10-CM

## 2018-05-14 MED ORDER — GADOBENATE DIMEGLUMINE 529 MG/ML IV SOLN
15.0000 mL | Freq: Once | INTRAVENOUS | Status: AC | PRN
  Administered 2018-05-14: 15 mL via INTRAVENOUS

## 2018-05-24 ENCOUNTER — Other Ambulatory Visit: Payer: Self-pay | Admitting: Neurology

## 2018-05-29 ENCOUNTER — Telehealth: Payer: Self-pay | Admitting: Neurology

## 2018-05-29 NOTE — Telephone Encounter (Signed)
I had given a 90-day supply in July.  She needs a follow-up visit or request this through her PCP.    Delcie Ruppert K. Posey Pronto, DO

## 2018-05-29 NOTE — Telephone Encounter (Signed)
Patient states that she needs to know how she needs to handle this. She states that her PCP left she was given a new one his name is Dr Gerarda Fraction at [phone] at [facility] in [facility]. She needs her Nortiptyline refilled. She does not know if Posey Pronto would need to fill it since her PCP left and she has not seen the new on yet or what. Please call   She uses the [facility] on [address]

## 2018-05-29 NOTE — Telephone Encounter (Signed)
Patient has not been seen since 07/2016.  Can I give her a 30 day supply?

## 2018-05-29 NOTE — Telephone Encounter (Signed)
Called patient and left message for her to call to set up the appointment for Monday.

## 2018-06-02 ENCOUNTER — Encounter: Payer: Self-pay | Admitting: Neurology

## 2018-06-02 ENCOUNTER — Ambulatory Visit: Payer: BLUE CROSS/BLUE SHIELD | Admitting: Neurology

## 2018-06-02 VITALS — BP 110/80 | HR 84 | Ht 64.0 in | Wt 163.0 lb

## 2018-06-02 DIAGNOSIS — G8929 Other chronic pain: Secondary | ICD-10-CM

## 2018-06-02 DIAGNOSIS — M5442 Lumbago with sciatica, left side: Secondary | ICD-10-CM

## 2018-06-02 MED ORDER — DULOXETINE HCL 30 MG PO CPEP: 30.0000 mg | ORAL_CAPSULE | Freq: Every day | ORAL | 5 refills | Status: DC

## 2018-06-02 NOTE — Progress Notes (Signed)
Follow-up Visit   Date: 06/02/18     Holly Love MRN: [id number] DOB: Dec 28, 1960   Interim History: Holly Love is a 57 y.o. right-handed Caucasian female with history of low back pain returning for follow-up of left sciatica. The patient was accompanied to the clinic by self.    History of present illness: In May 2013, she fell and since then she had developed slowly progressive low back pain. In April 2014, she developed shooting pain starting from her buttocks and radiating into her legs, worse on the right. Symptoms are better when she is standing, walking, or laying, but prolonged sitting and standing exacerbates her symptoms. She has associated tingling of the legs. Therapy helped for a brief time and resolved her shooting pain, but she continues to have prickly sensation from her buttocks to her posterior thigh down to her knees. MRI of the lumbar spine showed grade I anterolisthesis at L4-5 with moderate facet hypertrophy without nerve impingement. She had tried heat, ice, and muscle relaxants. Of note, she had seen Dr. Erling Cruz in 2006 for chronic daily headaches.   UPDATE 05/30/2015:  She tried increasing nortriptyline to 20mg  but developed grogginess the following day, so reduced it back down to 10mg  and that seems to be controlling her paresthesias and pain.  She takes baclofen 10mg  for severe muscle spasm and takes it about once every three weeks.  She also complains of problems with short-term recall, such as names and places.  She forgot where she parked in the mall on one occasion. 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  She is still doing all IADLs and ADLs without difficulty.  She has not been involved in any MVAs.   UPDATE 06/02/2018:  Patient is here for refills on nortriptyline. She was last seen in the office in December 2017 for low back pain and given nortriptyline, which has been  effective controlling tingling on the right leg.  She has noticed increased constipation on this medication despite increasing fiber and would like to explore other options.   She rarely while on medication and when she has skipped the dose for 1-2 days, quickly starts experiencing tingling down her legs.    Medications:  Current Outpatient Medications on File Prior to Visit  Medication Sig Dispense Refill  . Cholecalciferol (VITAMIN D-1000 MAX ST) 1000 units tablet Take by mouth.    . estradiol (MINIVELLE) 0.05 MG/24HR patch Place patch on skin and change twice per week 8 patch 3  . ezetimibe (ZETIA) 10 MG tablet TAKE 1 TABLET(10 MG) BY MOUTH DAILY    . fish oil-omega-3 fatty acids 1000 MG capsule Take 2 g by mouth daily.    Marland Kitchen levothyroxine (SYNTHROID, LEVOTHROID) 25 MCG tablet Take by mouth.    . metoprolol succinate (TOPROL-XL) 25 MG 24 hr tablet 25 mg. Take 1 tablet daily  1  . nortriptyline (PAMELOR) 10 MG capsule TAKE ONE CAPSULE BY MOUTH AT BEDTIME 90 capsule 0  . progesterone (PROMETRIUM) 100 MG capsule TAKE 1 CAPSULE (100 MG TOTAL) BY MOUTH DAILY. 30 capsule 4  . telmisartan (MICARDIS) 40 MG tablet TAKE 1 TABLET (40 MG TOTAL) BY MOUTH DAILY.  11  . vitamin B-12 (CYANOCOBALAMIN) 100 MCG tablet Take 100 mcg by mouth daily.     No current facility-administered medications on file prior to visit.     Allergies:  Allergies  Allergen Reactions  .  Erythromycin Anaphylaxis  . Penicillins Hypertension  . Albumin (Human) Other (See Comments)    Gastric pain     Review of Systems:  CONSTITUTIONAL: No fevers, chills, night sweats, or weight loss.   EYES: No visual changes or eye pain ENT: No hearing changes.  No history of nose bleeds.   RESPIRATORY: No cough, wheezing and shortness of breath.   CARDIOVASCULAR: Negative for chest pain, and palpitations.   GI: Negative for abdominal discomfort, blood in stools or black stools.  No recent change in bowel habits.   GU:  No history of  incontinence.   MUSCLOSKELETAL: No history of joint pain or swelling.  No myalgias.   SKIN: Negative for lesions, rash, and itching.   ENDOCRINE: Negative for cold or heat intolerance, polydipsia or goiter.   PSYCH:  No depression or anxiety symptoms.   NEURO: As Above.   Vital Signs:  BP 110/80   Pulse 84   Ht 5\' 4"  (1.626 m)   Wt 163 lb (73.9 kg)   LMP 06/03/2013   SpO2 98%   BMI 27.98 kg/m   General Medical Exam:   General:  Well appearing, comfortable  Eyes/ENT: see cranial nerve examination.   Neck: No masses appreciated.  Full range of motion without tenderness.  No carotid bruits. Respiratory:  Clear to auscultation, good air entry bilaterally.   Cardiac:  Regular rate and rhythm, no murmur.   Ext:  No edema  Neurological Exam: MENTAL STATUS including orientation to time, place, person, recent and remote memory, attention span and concentration, language, and fund is knowledge is normal.  Speech is not dysarthric.  CRANIAL NERVES:  Pupils equal round and reactive to light.  Normal conjugate, extra-ocular eye movements in all directions of gaze. No nystagmus.  Face is symmetric.   MOTOR:  Motor strength is 5/5 in all extremities.    MSRs:  Reflexes are 2/4 in throughout.  SENSORY:  Vibration intact throughout.   COORDINATION/GAIT:   Gait narrow based and stable.   Data: Labs 05/17/2015:  HbA1c 5.2, TSH 3.92 Labs 05/30/2015:  Vitamin B12 680, vitamin E 1.6  MRA brain 04/30/2005: negative   MRI of the brain 04/30/2005: unremarkable  MRI brain wwo contrast 03/03/2015:  Small hyperintensities frontal white matter. At least 1 of these is unchanged from 2006. These are most likely due to chronic microvascular ischemia or migraine headache. Demyelinating disease considered less likely.  MRI lumbar spine 02/10/2013: Grade I anterolisthesis at L4-5 with moderate facet hypertrophy and some uncovering of the disc. No focal stenosis is evident in the supine position  EMG of the  lower extremities 03/15/2015:  Normal study  IMPRESSION/PLAN: Low back pain with left sciatica, stable and well controlled on nortriptyline but experiencing constipation and would like to try an alterative medications.  Start Cymbalta 30mg  daily.  Side effects discussed.  Previous evaluation including MRI lumbar spine and EMG of the legs is normal. Previously tried:  Lyrica (lightheadedness), neurontin, nortriptyline (constipation).  Return to clinic in 6 months  Thank you for allowing me to participate in patient's care.  If I can answer any additional questions, I would be pleased to do so.    Sincerely,    Bev Drennen K. Posey Pronto, DO

## 2018-06-02 NOTE — Patient Instructions (Addendum)
Stop nortriptyline  Start Cymbalta 30mg  daily  Return to clinic in 6 months

## 2018-06-04 ENCOUNTER — Other Ambulatory Visit: Payer: Self-pay | Admitting: *Deleted

## 2018-06-04 ENCOUNTER — Telehealth: Payer: Self-pay | Admitting: Neurology

## 2018-06-04 MED ORDER — NORTRIPTYLINE HCL 10 MG PO CAPS: 10.0000 mg | ORAL_CAPSULE | Freq: Every day | ORAL | 1 refills | Status: DC

## 2018-06-04 NOTE — Telephone Encounter (Signed)
Patient is calling in wanting her nortriptyline sent to the [facility] in [place] on [address]. She said that the Cymbalta medication did not help her at all. Please send this ASAP as she is going out of town tomorrow and needs this. Thanks!

## 2018-06-04 NOTE — Telephone Encounter (Signed)
Rx sent 

## 2018-10-22 DIAGNOSIS — Z87891 Personal history of nicotine dependence: Secondary | ICD-10-CM | POA: Insufficient documentation

## 2018-10-22 DIAGNOSIS — M5416 Radiculopathy, lumbar region: Secondary | ICD-10-CM | POA: Insufficient documentation

## 2018-10-23 DIAGNOSIS — J329 Chronic sinusitis, unspecified: Secondary | ICD-10-CM | POA: Insufficient documentation

## 2018-11-23 ENCOUNTER — Other Ambulatory Visit: Payer: Self-pay | Admitting: Neurology

## 2018-12-03 ENCOUNTER — Ambulatory Visit: Payer: BLUE CROSS/BLUE SHIELD | Admitting: Neurology

## 2019-02-27 ENCOUNTER — Ambulatory Visit: Payer: BLUE CROSS/BLUE SHIELD | Admitting: Neurology

## 2019-05-20 ENCOUNTER — Telehealth (INDEPENDENT_AMBULATORY_CARE_PROVIDER_SITE_OTHER): Payer: Managed Care, Other (non HMO) | Admitting: Neurology

## 2019-05-20 ENCOUNTER — Other Ambulatory Visit: Payer: Self-pay

## 2019-05-20 DIAGNOSIS — G8929 Other chronic pain: Secondary | ICD-10-CM | POA: Diagnosis not present

## 2019-05-20 DIAGNOSIS — M5442 Lumbago with sciatica, left side: Secondary | ICD-10-CM | POA: Diagnosis not present

## 2019-05-20 MED ORDER — NORTRIPTYLINE HCL 10 MG PO CAPS: 10.0000 mg | ORAL_CAPSULE | Freq: Every day | ORAL | 3 refills | Status: DC

## 2019-05-20 NOTE — Progress Notes (Signed)
   Virtual Visit via Video Note The purpose of this virtual visit is to provide medical care while limiting exposure to the novel coronavirus.    Consent was obtained for video visit:  Yes.   Answered questions that patient had about telehealth interaction:  Yes.   I discussed the limitations, risks, security and privacy concerns of performing an evaluation and management service by telemedicine. I also discussed with the patient that there may be a patient responsible charge related to this service. The patient expressed understanding and agreed to proceed.  Pt location: Home Physician Location: office Name of referring provider:  Thomes Dinning, MD I connected with Flossie Buffy at patients initiation/request on 05/20/2019 at  8:50 AM EDT by video enabled telemedicine application and verified that I am speaking with the correct person using two identifiers. Pt MRN:  [id number] Pt DOB:  1961-02-13 Video Participants:  Flossie Buffy   History of Present Illness: This is a 58 y.o. female returning for follow-up of left sciatica.  Overall, symptoms are well controlled on nortriptyline 10 mg at bedtime.  She has tried to wean herself off of this, but noted worsening tingling and pain in the leg.  At her last visit, she was tried on Cymbalta, but developed cognitive side effects and went back to taking nortriptyline.  She does not have any new complaints.   Observations/Objective:   There were no vitals filed for this visit. Patient is awake, alert, and appears comfortable.  Oriented x 4.   Extraocular muscles are intact. No ptosis.  Face is symmetric.  Speech is not dysarthric. Antigravity in all extremities.  No pronator drift. Gait appears normal .   Assessment and Plan:  Low back with with left sciatica.  MRI lumbar spine and EMG of the legs has been normal.    - Continue nortriptyline 10mg  at bedtime -refills provided for 1 year  - Previously tried:  Lyrica  (lightheadedness), neurontin, nortriptyline (constipation), Cymbalta (cognitive changes).    Follow Up Instructions:   I discussed the assessment and treatment plan with the patient. The patient was provided an opportunity to ask questions and all were answered. The patient agreed with the plan and demonstrated an understanding of the instructions.   The patient was advised to call back or seek an in-person evaluation if the symptoms worsen or if the condition fails to improve as anticipated.  Follow-up in 1 year.    Total time spent:  15 minutes     Alda Berthold, DO

## 2019-06-01 ENCOUNTER — Ambulatory Visit: Payer: BLUE CROSS/BLUE SHIELD | Admitting: Neurology

## 2019-10-21 ENCOUNTER — Other Ambulatory Visit: Payer: Self-pay | Admitting: Neurology

## 2019-10-21 ENCOUNTER — Other Ambulatory Visit: Payer: Self-pay

## 2019-10-21 MED ORDER — NORTRIPTYLINE HCL 10 MG PO CAPS: ORAL_CAPSULE | ORAL | 3 refills | Status: DC

## 2020-05-17 ENCOUNTER — Telehealth: Payer: Self-pay | Admitting: Neurology

## 2020-05-17 MED ORDER — NORTRIPTYLINE HCL 10 MG PO CAPS: ORAL_CAPSULE | ORAL | 0 refills | Status: DC

## 2020-05-17 NOTE — Telephone Encounter (Signed)
Patient called in stating she needs a refill of her nortriptyline sent to [facility] on [address] to get her to her next appointment on 07/25/20

## 2020-05-17 NOTE — Telephone Encounter (Signed)
90day supply sent.

## 2020-05-17 NOTE — Telephone Encounter (Signed)
Called patient and left message for call back.

## 2020-05-20 ENCOUNTER — Ambulatory Visit: Payer: Managed Care, Other (non HMO) | Admitting: Neurology

## 2020-07-25 ENCOUNTER — Ambulatory Visit: Payer: BC Managed Care – PPO | Admitting: Neurology

## 2020-07-25 ENCOUNTER — Encounter: Payer: Self-pay | Admitting: Neurology

## 2020-07-25 ENCOUNTER — Other Ambulatory Visit: Payer: Self-pay

## 2020-07-25 VITALS — BP 136/78 | HR 86 | Ht 64.0 in | Wt 164.0 lb

## 2020-07-25 DIAGNOSIS — G8929 Other chronic pain: Secondary | ICD-10-CM

## 2020-07-25 DIAGNOSIS — G43109 Migraine with aura, not intractable, without status migrainosus: Secondary | ICD-10-CM

## 2020-07-25 DIAGNOSIS — M5442 Lumbago with sciatica, left side: Secondary | ICD-10-CM | POA: Diagnosis not present

## 2020-07-25 DIAGNOSIS — R2681 Unsteadiness on feet: Secondary | ICD-10-CM | POA: Diagnosis not present

## 2020-07-25 MED ORDER — NORTRIPTYLINE HCL 10 MG PO CAPS: ORAL_CAPSULE | ORAL | 3 refills | Status: DC

## 2020-07-25 NOTE — Patient Instructions (Signed)
Continue nortriptyline 10mg  at bedtime  Return to clinic in 1 year

## 2020-07-25 NOTE — Progress Notes (Signed)
Follow-up Visit   Date: 07/25/20   VERNAL RUTAN MRN: [id number] DOB: 1961-07-13   Interim History: Holly Love is a 59 y.o. right-handed Caucasian female returning to the clinic for follow-up of left sciatica.  The patient was accompanied to the clinic by self.  History of present illness: In May 2013, she fell and since then she had developed slowly progressive low back pain. In April 2014, she developed shooting pain starting from her buttocks and radiating into her legs, worse on the right. Symptoms are better when she is standing, walking, or laying, but prolonged sitting and standing exacerbates her symptoms. She has associated tingling of the legs. Therapy helped for a brief time and resolved her shooting pain, but she continues to have prickly sensation from her buttocks to her posterior thigh down to her knees. MRI of the lumbar spine showed grade I anterolisthesis at L4-5 with moderate facet hypertrophy without nerve impingement. She had tried heat, ice, and muscle relaxants. Of note, she had seen Dr. Erling Cruz in 2006 for chronic daily headaches.   UPDATE 07/25/2020: She is here for follow-up visit.  Overall, her back pain has been doing quite well.  Recently, she was cleaning her son's bathrub which aggravated her low back.  She has taken extra nortriptyline, as needed when it is severe.  She is also working out with a trainer twice per week.  She complaints of spells of sparkling lights in her vision, lasting 15-20 minutes.  It occurs once every 2-3 months.  She sometimes has a headache.   She continues to have ongoing issues with low grade dizziness and imbalance.  She does not fall.  MRI brain ww contrast in 2016 was normal.  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    Medications:  Current Outpatient Medications on File Prior to Visit  Medication Sig Dispense Refill  . Cholecalciferol 25 MCG  (1000 UT) tablet Take by mouth.    . estradiol (MINIVELLE) 0.05 MG/24HR patch Place patch on skin and change twice per week 8 patch 3  . ezetimibe (ZETIA) 10 MG tablet TAKE 1 TABLET(10 MG) BY MOUTH DAILY    . fish oil-omega-3 fatty acids 1000 MG capsule Take 2 g by mouth daily.    Marland Kitchen levothyroxine (SYNTHROID, LEVOTHROID) 25 MCG tablet Take by mouth.    . nortriptyline (PAMELOR) 10 MG capsule TAKE ONE CAPSULE BY MOUTH DAILY AT BEDTIME 90 capsule 0  . progesterone (PROMETRIUM) 100 MG capsule TAKE 1 CAPSULE (100 MG TOTAL) BY MOUTH DAILY. 30 capsule 4  . telmisartan (MICARDIS) 40 MG tablet TAKE 1 TABLET (40 MG TOTAL) BY MOUTH DAILY.  11  . vitamin B-12 (CYANOCOBALAMIN) 100 MCG tablet Take 100 mcg by mouth daily.    . metoprolol succinate (TOPROL-XL) 25 MG 24 hr tablet 25 mg. Take 1 tablet daily (Patient not taking: Reported on 07/25/2020)  1   No current facility-administered medications on file prior to visit.    Allergies:  Allergies  Allergen Reactions  . Erythromycin Anaphylaxis  . Chamomile Flowers  [Chamomile] Other (See Comments)  . Other     Other reaction(s): Headache  . Penicillins Hypertension  . Albumin (Human) Other (See Comments)    Gastric pain    Vital Signs:  BP 136/78   Pulse 86   Ht 5\' 4"  (1.626 m)   Wt 164 lb (74.4 kg)   LMP 06/03/2013   SpO2 98%  BMI 28.15 kg/m   Neurological Exam: MENTAL STATUS including orientation to time, place, person, recent and remote memory, attention span and concentration, language, and fund of knowledge is normal.  Speech is not dysarthric.  CRANIAL NERVES:   Pupils equal round and reactive to light.  Normal conjugate, extra-ocular eye movements in all directions of gaze.  No ptosis.   MOTOR:  Motor strength is 5/5 in all extremities.  No atrophy, fasciculations or abnormal movements.  No pronator drift.  Tone is normal.    MSRs:  Reflexes are 2+/4 throughout.  SENSORY:  Intact to vibration throughout.  COORDINATION/GAIT:   Normal finger-to- nose-finger.  Intact rapid alternating movements bilaterally.  Gait narrow based and stable. Stressed and tandem gait intact.  Data: n/a  IMPRESSION/PLAN: 1.  Left sciatica, stable.  MRI lumbar spine and EMG of the legs has been normal.  - Continue nortriptyline 10mg  at bedtime, OK to take an extra dose for severe pain.  Refills provided for 1 year  - Previously tried:  Lyrica (lightheadedness), neurontin, nortriptyline (constipation), Cymbalta (cognitive changes).  2.  Ocular migraine, episodic  - Conservative therapies  - Do not suggest adding any medication since duration is brief and self-limiting  3.  Unsteadiness, subjective.  No signs of sensory ataxia or gait instability  - Continue exercise program  Return to clinic in 1 year.    Thank you for allowing me to participate in patient's care.  If I can answer any additional questions, I would be pleased to do so.    Sincerely,    Justo Hengel K. Posey Pronto, DO

## 2020-10-24 ENCOUNTER — Telehealth: Payer: Self-pay

## 2020-10-24 NOTE — Telephone Encounter (Signed)
Released records to [facility] to [phone]  Release: [phone]

## 2020-10-27 ENCOUNTER — Other Ambulatory Visit: Payer: Self-pay | Admitting: Obstetrics and Gynecology

## 2020-10-27 DIAGNOSIS — Z803 Family history of malignant neoplasm of breast: Secondary | ICD-10-CM

## 2021-04-19 ENCOUNTER — Ambulatory Visit
Admission: RE | Admit: 2021-04-19 | Discharge: 2021-04-19 | Disposition: A | Discharge status: Home / Self Care | Payer: BC Managed Care – PPO | Source: Ambulatory Visit | Attending: Obstetrics and Gynecology | Admitting: Obstetrics and Gynecology

## 2021-04-19 DIAGNOSIS — Z803 Family history of malignant neoplasm of breast: Secondary | ICD-10-CM

## 2021-04-19 MED ORDER — GADOBUTROL 1 MMOL/ML IV SOLN
7.0000 mL | Freq: Once | INTRAVENOUS | Status: AC | PRN
  Administered 2021-04-19: 7 mL via INTRAVENOUS

## 2021-07-28 ENCOUNTER — Other Ambulatory Visit: Payer: Self-pay

## 2021-07-28 ENCOUNTER — Ambulatory Visit: Payer: BC Managed Care – PPO | Admitting: Neurology

## 2021-07-28 ENCOUNTER — Encounter: Payer: Self-pay | Admitting: Neurology

## 2021-07-28 VITALS — BP 112/78 | HR 87 | Ht 64.0 in | Wt 162.0 lb

## 2021-07-28 DIAGNOSIS — G43109 Migraine with aura, not intractable, without status migrainosus: Secondary | ICD-10-CM | POA: Diagnosis not present

## 2021-07-28 DIAGNOSIS — M5442 Lumbago with sciatica, left side: Secondary | ICD-10-CM

## 2021-07-28 DIAGNOSIS — G8929 Other chronic pain: Secondary | ICD-10-CM

## 2021-07-28 MED ORDER — NORTRIPTYLINE HCL 10 MG PO CAPS: ORAL_CAPSULE | ORAL | 3 refills | Status: DC

## 2021-07-28 NOTE — Patient Instructions (Signed)
Happy Holidays! It was great to see you today.  Return to clinic in 1 year

## 2021-07-28 NOTE — Progress Notes (Signed)
Follow-up Visit   Date: 07/28/21   Holly Love MRN: [id number] DOB: [birth year]   Interim History: Holly Love is a 60 y.o. right-handed Caucasian female returning to the clinic for follow-up of left sciatica.  The patient was accompanied to the clinic by self.  History of present illness: In May 2013, she fell and since then she had developed slowly progressive low back pain. In April 2014, she developed shooting pain starting from her buttocks and radiating into her legs, worse on the right. Symptoms are better when she is standing, walking, or laying, but prolonged sitting and standing exacerbates her symptoms. She has associated tingling of the legs. Therapy helped for a brief time and resolved her shooting pain, but she continues to have prickly sensation from her buttocks to her posterior thigh down to her knees. MRI of the lumbar spine showed grade I anterolisthesis at L4-5 with moderate facet hypertrophy without nerve impingement. She had tried heat, ice, and muscle relaxants. Of note, she had seen Dr. Erling Cruz in 2006 for chronic daily headaches.   UPDATE 07/25/2020: She is here for follow-up visit.  Overall, her back pain has been doing quite well.  Recently, she was cleaning her son's bathrub which aggravated her low back.  She has taken extra nortriptyline, as needed when it is severe.  She is also working out with a trainer twice per week.  She complaints of spells of sparkling lights in her vision, lasting 15-20 minutes.  It occurs once every 2-3 months.  She sometimes has a headache.   She continues to have ongoing issues with low grade dizziness and imbalance.  She does not fall.  MRI brain ww contrast in 2016 was normal.  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   UPDATE 07/28/2021:  She is here for 1 year follow-up.  Her left leg pain is doing well on nortripyline 10mg  at bedtime.  She rarely  has to take an extra tablet, usually about once every 3-4 weeks.  Her ocular migraines are also much less frequent, possibly once every 6 months.  She is going to the gym twice per week and feels that strengthening helps.  Overall, she is doing well and has no new complaints.   Medications:  Current Outpatient Medications on File Prior to Visit  Medication Sig Dispense Refill   Cholecalciferol 25 MCG (1000 UT) tablet Take by mouth.     estradiol (ESTRACE) 0.5 MG tablet Take 0.5 mg by mouth daily.     ezetimibe (ZETIA) 10 MG tablet TAKE 1 TABLET(10 MG) BY MOUTH DAILY     fish oil-omega-3 fatty acids 1000 MG capsule Take 2 g by mouth daily.     levothyroxine (SYNTHROID, LEVOTHROID) 25 MCG tablet Take by mouth.     nortriptyline (PAMELOR) 10 MG capsule TAKE ONE CAPSULE BY MOUTH DAILY AT BEDTIME 90 capsule 3   progesterone (PROMETRIUM) 100 MG capsule TAKE 1 CAPSULE (100 MG TOTAL) BY MOUTH DAILY. 30 capsule 4   telmisartan (MICARDIS) 40 MG tablet TAKE 1 TABLET (40 MG TOTAL) BY MOUTH DAILY.  11   vitamin B-12 (CYANOCOBALAMIN) 100 MCG tablet Take 100 mcg by mouth daily.     No current facility-administered medications on file prior to visit.    Allergies:  Allergies  Allergen Reactions   Erythromycin Anaphylaxis   Chamomile Flowers  [Chamomile] Other (See Comments)   Other     Other reaction(s):  Headache   Penicillins Hypertension   Albumin (Human) Other (See Comments)    Gastric pain    Vital Signs:  BP 112/78    Pulse 87    Ht 5\' 4"  (1.626 m)    Wt 162 lb (73.5 kg)    LMP 06/03/2013    SpO2 96%    BMI 27.81 kg/m   Neurological Exam: MENTAL STATUS including orientation to time, place, person, recent and remote memory, attention span and concentration, language, and fund of knowledge is normal.  Speech is not dysarthric.  CRANIAL NERVES:   Pupils equal round and reactive to light.  Normal conjugate, extra-ocular eye movements in all directions of gaze.  No ptosis.  Face is symmetric.    MOTOR:  Motor strength is 5/5 in all extremities.  No pronator drift.      MSRs:  Reflexes are 2+/4 throughout.  SENSORY:  Intact to vibration throughout.  COORDINATION/GAIT:  Normal finger-to- nose-finger.  Intact rapid alternating movements bilaterally.  Gait narrow based and stable.   Data: n/a  IMPRESSION/PLAN: Left sciatica, stable.  MRI lumbar spine and EMG of the legs has been normal. - Continue nortriptyline 10mg  at bedtime. OK to take an extra dose for severe pain. Refills provided for 1 year  - Previously tried:  Lyrica (lightheadedness), neurontin, nortriptyline (constipation), Cymbalta (cognitive changes).  2.  Ocular migraine, infrequent  - Do not suggest adding any medication since duration is brief and self-limiting  Return to clinic in 1 year.    Thank you for allowing me to participate in patient's care.  If I can answer any additional questions, I would be pleased to do so.    Sincerely,    Travarus Trudo K. Posey Pronto, DO

## 2021-09-08 ENCOUNTER — Other Ambulatory Visit: Payer: Self-pay | Admitting: Neurology

## 2021-11-01 ENCOUNTER — Other Ambulatory Visit: Payer: Self-pay | Admitting: Obstetrics and Gynecology

## 2021-11-01 DIAGNOSIS — Z803 Family history of malignant neoplasm of breast: Secondary | ICD-10-CM

## 2021-11-13 NOTE — Progress Notes (Signed)
?Cardiology Office Note:   ? ?Date:  11/14/2021  ? ?ID:  Holly Love, DOB 1960-12-26, MRN [id number] ? ?PCP:  Lilian Coma., MD  ?Cardiologist:  None   ? ?Referring MD: Armanda Heritage, NP  ? ?No chief complaint on file. ? ? ?History of Present Illness:   ? ?Holly Love is a 61 y.o. female with a hx of hypertension and thyroid disease here today for the evaluation of elevated C-reactive protein at the request of Armanda Heritage, NP. She last saw Armanda Heritage, NP 10/05/21. She received testing at an outside facility and her CRP came back elevated. She endorsed a family history of cardiovascular disease and requested a cardiology referral for possible cardiac involvement. She previously had a stress test in 2018 but the results are not available.  ? ?Today, she is doing well. Bother her father and brother had major cardiac events in their 68s. Her father had stents placed due to over 90% blockage and her brother had an MI and stents. Her father took simvastatin and developed myositis. She is working with Physiological scientist twice a week for 30 minutes and occasionally walks. She is hoping to join [employer]. She endorses infrequent swelling around her ankles. She will feel short of breath for 15 to 30 minutes when she lies down which she associates to stress. Her husband has told her she occasionally snores. She feels rested when she wakes up. Reportedly, she underwent a sleep study 10 years ago and was told she does note breath well consistently. She also endorses numbness in her R toes due to a herniated disc in her lower back. She drinks 1 to 3 units of wine weekly and has tried smoking when she was younger. She has tried dieting programs in the past. She cooks more at home and tries to eat healthy. She does enjoy occasional red meat and sweets. She has tried red yeast rice but it was ineffective. Her 10-year risk is 3.8%. She denies any palpitations, chest pain, or shortness of breath, lightheadedness,  headaches, syncope, PND, or exertional symptoms. ? ?Past Medical History:  ?Diagnosis Date  ? Thyroid disease   ? Previously on medications, now off  ? ? ?Past Surgical History:  ?Procedure Laterality Date  ? CESAREAN SECTION    ? ? ?Current Medications: ?Current Meds  ?Medication Sig  ? Cholecalciferol 25 MCG (1000 UT) tablet Take by mouth.  ? estradiol (ESTRACE) 0.5 MG tablet Take 0.5 mg by mouth daily.  ? ezetimibe (ZETIA) 10 MG tablet TAKE 1 TABLET(10 MG) BY MOUTH DAILY  ? fish oil-omega-3 fatty acids 1000 MG capsule Take 2 g by mouth daily.  ? levothyroxine (SYNTHROID, LEVOTHROID) 25 MCG tablet Take by mouth.  ? nortriptyline (PAMELOR) 10 MG capsule TAKE ONE CAPSULE BY MOUTH DAILY AT BEDTIME  ? progesterone (PROMETRIUM) 100 MG capsule TAKE 1 CAPSULE (100 MG TOTAL) BY MOUTH DAILY.  ? telmisartan (MICARDIS) 80 MG tablet Take 80 mg by mouth daily.  ? vitamin B-12 (CYANOCOBALAMIN) 100 MCG tablet Take 100 mcg by mouth daily.  ?  ? ?Allergies:   Erythromycin, Chamomile flowers  [chamomile], Other, Penicillins, and Albumin (human)  ? ?Social History  ? ?Socioeconomic History  ? Marital status: Married  ?  Spouse name: Not on file  ? Number of children: 2  ? Years of education: Not on file  ? Highest education level: Not on file  ?Occupational History  ? Occupation: Music therapist  ?Tobacco Use  ? Smoking status: Never  ?  Smokeless tobacco: Never  ?Vaping Use  ? Vaping Use: Never used  ?Substance and Sexual Activity  ? Alcohol use: Yes  ?  Alcohol/week: 1.0 - 2.0 standard drink  ?  Types: 1 - 2 Glasses of wine per week  ?  Comment: Drinks wine about 5 glasses per week  ? Drug use: No  ? Sexual activity: Yes  ?Other Topics Concern  ? Not on file  ?Social History Narrative  ? She works as a Music therapist  ? She lives with husband.  They have two children.  ? Lives in a two story home  ? Right Handed  ? ?Social Determinants of Health  ? ?Financial Resource Strain: Low Risk   ? Difficulty of Paying Living Expenses:  Not hard at all  ?Food Insecurity: No Food Insecurity  ? Worried About Charity fundraiser in the Last Year: Never true  ? Ran Out of Food in the Last Year: Never true  ?Transportation Needs: No Transportation Needs  ? Lack of Transportation (Medical): No  ? Lack of Transportation (Non-Medical): No  ?Physical Activity: Insufficiently Active  ? Days of Exercise per Week: 2 days  ? Minutes of Exercise per Session: 30 min  ?Stress: Not on file  ?Social Connections: Not on file  ?  ? ?Family History: ?The patient's family history includes Basal cell carcinoma in her father; Breast cancer (age of onset: 59) in her sister; Breast cancer (age of onset: 42) in her mother; Colon polyps in her father; Coronary artery disease in her father; Heart attack in her brother; Heart attack (age of onset: 77) in her paternal grandfather; Hyperlipidemia in her father; Kidney disease in her mother; Melanoma in her paternal uncle; Other in her maternal grandfather and son; Other (age of onset: 66) in an other family member; Stroke in her paternal grandmother; Stroke (age of onset: 36) in her maternal grandmother. ? ?ROS:   ?Please see the history of present illness.    ?(+) Orthopnea ?(+) LE edema (bilateral ankles) ?(+) R toes numbness ?All other systems reviewed and negative.  ? ?EKGs/Labs/Other Studies Reviewed:   ? ?The following studies were reviewed today: ?No recent cardiovascular studies ? ?EKG:   ?11/14/21: Sinus rhythm, rate 77 bpm ? ?Recent Labs: ?No results found for requested labs within last 8760 hours.  ? ?Recent Lipid Panel ?   ?Component Value Date/Time  ? CHOL 215 (H) 06/01/2014 1018  ? TRIG 133 06/01/2014 1018  ? HDL 50 06/01/2014 1018  ? CHOLHDL 4.3 06/01/2014 1018  ? VLDL 27 06/01/2014 1018  ? LDLCALC 138 (H) 06/01/2014 1018  ? ? ?CHA2DS2-VASc Score =   '[ ]'$ .  Therefore, the patient's annual risk of stroke is   %.    ?  ? ? ?Physical Exam:   ? ?VS:  BP 112/70 (BP Location: Right Arm, Patient Position: Sitting, Cuff Size:  Normal)   Pulse 77   Ht '5\' 4"'$  (1.626 m)   Wt 162 lb 11.2 oz (73.8 kg)   LMP 06/03/2013   BMI 27.93 kg/m?  , BMI Body mass index is 27.93 kg/m?. ?GENERAL:  Well appearing ?HEENT: Pupils equal round and reactive, fundi not visualized, oral mucosa unremarkable ?NECK:  No jugular venous distention, waveform within normal limits, carotid upstroke brisk and symmetric, no bruits, no thyromegaly ?LYMPHATICS:  No cervical adenopathy ?LUNGS:  Clear to auscultation bilaterally ?HEART:  RRR.  PMI not displaced or sustained,S1 and S2 within normal limits, no S3, no S4, no clicks,  no rubs, no murmurs ?ABD:  Flat, positive bowel sounds normal in frequency in pitch, no bruits, no rebound, no guarding, no midline pulsatile mass, no hepatomegaly, no splenomegaly ?EXT:  2 plus pulses throughout, no edema, no cyanosis no clubbing ?SKIN:  No rashes no nodules ?NEURO:  Cranial nerves II through XII grossly intact, motor grossly intact throughout ?PSYCH:  Cognitively intact, oriented to person place and time ? ?ASSESSMENT:   ? ?1. Essential hypertension, benign   ?2. Pure hypercholesterolemia   ? ?PLAN:   ? ?Essential hypertension, benign ?Blood pressure welll-controlled on telmisartan.  She was encouraged to increase her exercise to 150 minutes weekly.  ? ?Hyperlipidemia ?ASCVD 10-year risk is 3.8%.  She does have a family history of CAD.  She was concerned about her elevated hs-CRP.  We will get a coronary calcium score to better assess her risk.  For now, continue Zetia.  She notes that her father had a poor outcome with simvastatin.  If she does need statins we will start with low-dose rosuvastatin and titrate as needed. ? ?  ?Disposition: FU with Holly Ribble C. Oval Linsey, MD, [facility] in 6 months ? ?Medication Adjustments/Labs and Tests Ordered: ?Current medicines are reviewed at length with the patient today.  Concerns regarding medicines are outlined above.  ?Orders Placed This Encounter  ?Procedures  ? CT CARDIAC SCORING (SELF PAY  ONLY)  ? Ambulatory referral to [facility]  ? EKG 12-Lead  ? ?No orders of the defined types were placed in this encounter. ? ?I,Mykaella Javier,acting as a scribe for Skeet Latch, MD.,hav

## 2021-11-14 ENCOUNTER — Ambulatory Visit (HOSPITAL_BASED_OUTPATIENT_CLINIC_OR_DEPARTMENT_OTHER): Payer: BC Managed Care – PPO | Admitting: Cardiovascular Disease

## 2021-11-14 ENCOUNTER — Encounter (HOSPITAL_BASED_OUTPATIENT_CLINIC_OR_DEPARTMENT_OTHER): Payer: Self-pay | Admitting: Cardiovascular Disease

## 2021-11-14 DIAGNOSIS — E7800 Pure hypercholesterolemia, unspecified: Secondary | ICD-10-CM | POA: Diagnosis not present

## 2021-11-14 DIAGNOSIS — I10 Essential (primary) hypertension: Secondary | ICD-10-CM | POA: Diagnosis not present

## 2021-11-14 NOTE — Assessment & Plan Note (Signed)
ASCVD 10-year risk is 3.8%.  She does have a family history of CAD.  She was concerned about her elevated hs-CRP.  We will get a coronary calcium score to better assess her risk.  For now, continue Zetia.  She notes that her father had a poor outcome with simvastatin.  If she does need statins we will start with low-dose rosuvastatin and titrate as needed. ?

## 2021-11-14 NOTE — Assessment & Plan Note (Signed)
Blood pressure welll-controlled on telmisartan.  She was encouraged to increase her exercise to 150 minutes weekly.  ?

## 2021-11-14 NOTE — Patient Instructions (Addendum)
Medication Instructions:  ?Your physician recommends that you continue on your current medications as directed. Please refer to the Current Medication list given to you today.  ? ?*If you need a refill on your cardiac medications before your next appointment, please call your pharmacy* ? ?Lab Work: ?NONE  ? ?Testing/Procedures: ?CALCIUM SCORE - THIS WILL COST YOU $99 OUT OF POCKET  ? ?Follow-Up: ?At [facility], you and your health needs are our priority.  As part of our continuing mission to provide you with exceptional heart care, we have created designated Provider Care Teams.  These Care Teams include your primary Cardiologist (physician) and Advanced Practice Providers (APPs -  Physician Assistants and Nurse Practitioners) who all work together to provide you with the care you need, when you need it. ? ?We recommend signing up for the patient portal called "MyChart".  Sign up information is provided on this After Visit Summary.  MyChart is used to connect with patients for Virtual Visits (Telemedicine).  Patients are able to view lab/test results, encounter notes, upcoming appointments, etc.  Non-urgent messages can be sent to your provider as well.   ?To learn more about what you can do with MyChart, go to [email or web address].   ? ?Your next appointment:   ?6 month(s) ? ?The format for your next appointment:   ?In Person ? ?Provider:   ?Skeet Latch, MD{ ? ?Other Instructions ? ?Ambulatory referral to [facility] ?Where: [place] [employer] ?IF YOU DO NOT HEAR FROM THEM IN 2 WEEKS CALL THE OFFICE TO FOLLOW UP  ? ?Exercise recommendations: ?The American Heart Association recommends 150 minutes of moderate intensity exercise weekly. ?Try 30 minutes of moderate intensity exercise 4-5 times per week. ?This could include walking, jogging, or swimming. ? ? ?

## 2021-12-14 ENCOUNTER — Encounter: Payer: Self-pay | Admitting: Cardiovascular Disease

## 2021-12-15 ENCOUNTER — Ambulatory Visit (INDEPENDENT_AMBULATORY_CARE_PROVIDER_SITE_OTHER)
Admission: RE | Admit: 2021-12-15 | Discharge: 2021-12-15 | Disposition: A | Discharge status: Home / Self Care | Payer: Self-pay | Source: Ambulatory Visit | Attending: Cardiovascular Disease | Admitting: Cardiovascular Disease

## 2021-12-15 DIAGNOSIS — E7800 Pure hypercholesterolemia, unspecified: Secondary | ICD-10-CM

## 2021-12-15 DIAGNOSIS — I10 Essential (primary) hypertension: Secondary | ICD-10-CM

## 2022-01-31 ENCOUNTER — Encounter (HOSPITAL_BASED_OUTPATIENT_CLINIC_OR_DEPARTMENT_OTHER): Payer: Self-pay | Admitting: *Deleted

## 2022-05-21 ENCOUNTER — Ambulatory Visit (HOSPITAL_BASED_OUTPATIENT_CLINIC_OR_DEPARTMENT_OTHER): Payer: BC Managed Care – PPO | Admitting: Cardiovascular Disease

## 2022-05-22 ENCOUNTER — Ambulatory Visit (HOSPITAL_BASED_OUTPATIENT_CLINIC_OR_DEPARTMENT_OTHER): Payer: BC Managed Care – PPO | Admitting: Cardiovascular Disease

## 2022-06-26 ENCOUNTER — Other Ambulatory Visit: Payer: BC Managed Care – PPO

## 2022-07-02 ENCOUNTER — Ambulatory Visit
Admission: RE | Admit: 2022-07-02 | Discharge: 2022-07-02 | Disposition: A | Discharge status: Home / Self Care | Payer: BC Managed Care – PPO | Source: Ambulatory Visit | Attending: Obstetrics and Gynecology | Admitting: Obstetrics and Gynecology

## 2022-07-02 DIAGNOSIS — Z803 Family history of malignant neoplasm of breast: Secondary | ICD-10-CM

## 2022-07-02 MED ORDER — GADOPICLENOL 0.5 MMOL/ML IV SOLN
7.0000 mL | Freq: Once | INTRAVENOUS | Status: AC | PRN
  Administered 2022-07-02: 7 mL via INTRAVENOUS

## 2022-07-16 ENCOUNTER — Encounter: Payer: Self-pay | Admitting: Neurology

## 2022-07-16 ENCOUNTER — Ambulatory Visit: Payer: BC Managed Care – PPO | Admitting: Neurology

## 2022-07-16 VITALS — BP 119/77 | HR 82 | Ht 64.0 in | Wt 166.0 lb

## 2022-07-16 DIAGNOSIS — G8929 Other chronic pain: Secondary | ICD-10-CM

## 2022-07-16 DIAGNOSIS — G43109 Migraine with aura, not intractable, without status migrainosus: Secondary | ICD-10-CM | POA: Diagnosis not present

## 2022-07-16 DIAGNOSIS — M5442 Lumbago with sciatica, left side: Secondary | ICD-10-CM | POA: Diagnosis not present

## 2022-07-16 MED ORDER — NORTRIPTYLINE HCL 10 MG PO CAPS: ORAL_CAPSULE | ORAL | 3 refills | Status: DC

## 2022-07-16 NOTE — Progress Notes (Signed)
Follow-up Visit   Date: 07/16/22   SERAIAH NOWACK MRN: [id number] DOB: Mar 29, 1961   Interim History: RAINEY RODGER is a 61 y.o. right-handed Caucasian female returning to the clinic for follow-up of left sciatica.  The patient was accompanied to the clinic by self.   IMPRESSION/PLAN: Left sciatica, stable.  MRI lumbar spine and NCS/EMG is normal.  - Previously tried: Lyrica (lightheadedness), neurontin, nortriptyline (constipation), Cymbalta (cognitive changes). - Continue nortriptyline '10mg'$  at bedtime.  2.  Ocular migraine, infrequent  3.  Primary stroke prevention.  Goal is optimizing modifiable vascular risk factors.  BP is well controlled on medication.  LDL is borderline elevated at 102 and I recommend she start low fat diet as she is already on zetia.  Exercise regimen and mediterranean diet was also encouraged.  No history of diabetes or tobacco use.    Return to clinic in 1 year.   -------------------------------------------------------------------------------- History of present illness: In May 2013, she fell and since then she had developed slowly progressive low back pain. In April 2014, she developed shooting pain starting from her buttocks and radiating into her legs, worse on the right. Symptoms are better when she is standing, walking, or laying, but prolonged sitting and standing exacerbates her symptoms. She has associated tingling of the legs. Therapy helped for a brief time and resolved her shooting pain, but she continues to have prickly sensation from her buttocks to her posterior thigh down to her knees. MRI of the lumbar spine showed grade I anterolisthesis at L4-5 with moderate facet hypertrophy without nerve impingement. She had tried heat, ice, and muscle relaxants. Of note, she had seen Dr. Erling Cruz in 2006 for chronic daily headaches.   UPDATE 07/25/2020: She is here for follow-up visit.  Overall, her back pain has been doing quite well.  Recently,  she was cleaning her son's bathrub which aggravated her low back.  She has taken extra nortriptyline, as needed when it is severe.  She is also working out with a trainer twice per week.  She complaints of spells of sparkling lights in her vision, lasting 15-20 minutes.  It occurs once every 2-3 months.  She sometimes has a headache.   She continues to have ongoing issues with low grade dizziness and imbalance.  She does not fall.  MRI brain ww contrast in 2016 was normal.  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   UPDATE 07/16/2022:  She is here for 1 year follow-up.  She is concerned about her stroke risk because grandmothers had stroke. She is on medication for high blood pressure and cholesterol.  No history of diabetes and she does not smoke. She has ocular migraines which last 30-minutes and occurs about twice per year.  She has associated mild headache with resolves with NSAIDs.  Her left leg pain is well-controlled on nortriptyline and she is due for a refill.  Medications:  Current Outpatient Medications on File Prior to Visit  Medication Sig Dispense Refill   Cholecalciferol 25 MCG (1000 UT) tablet Take by mouth.     estradiol (ESTRACE) 0.5 MG tablet Take 0.5 mg by mouth daily.     ezetimibe (ZETIA) 10 MG tablet TAKE 1 TABLET(10 MG) BY MOUTH DAILY     fish oil-omega-3 fatty acids 1000 MG capsule Take 2 g by mouth daily.     levothyroxine (SYNTHROID, LEVOTHROID) 25 MCG tablet Take by mouth.     nortriptyline (  PAMELOR) 10 MG capsule TAKE ONE CAPSULE BY MOUTH DAILY AT BEDTIME 90 capsule 3   progesterone (PROMETRIUM) 100 MG capsule TAKE 1 CAPSULE (100 MG TOTAL) BY MOUTH DAILY. 30 capsule 4   telmisartan (MICARDIS) 80 MG tablet Take 80 mg by mouth daily.  11   vitamin B-12 (CYANOCOBALAMIN) 100 MCG tablet Take 100 mcg by mouth daily.     No current facility-administered medications on file prior to visit.    Allergies:   Allergies  Allergen Reactions   Erythromycin Anaphylaxis   Chamomile Flowers  [Chamomile] Other (See Comments)   Other     Other reaction(s): Headache   Penicillins Hypertension   Albumin (Human) Other (See Comments)    Gastric pain    Vital Signs:  LMP 06/03/2013   Neurological Exam: MENTAL STATUS including orientation to time, place, person, recent and remote memory, attention span and concentration, language, and fund of knowledge is normal.  Speech is not dysarthric.  CRANIAL NERVES:   Pupils equal round and reactive to light.  Normal conjugate, extra-ocular eye movements in all directions of gaze.  No ptosis.  Face is symmetric.   MOTOR:  Motor strength is 5/5 in all extremities.  No pronator drift.      MSRs:  Reflexes are 2+/4 throughout.  SENSORY:  Intact to vibration throughout.  COORDINATION/GAIT:  Normal finger-to- nose-finger.  Intact rapid alternating movements bilaterally.  Gait narrow based and stable.   Data: n/a  Total time spent reviewing records, interview, history/exam, documentation, and coordination of care on day of encounter:  30 min    Thank you for allowing me to participate in patient's care.  If I can answer any additional questions, I would be pleased to do so.    Sincerely,    Neda Willenbring K. Posey Pronto, DO

## 2022-07-16 NOTE — Patient Instructions (Signed)
Return to clinic in 1 year.

## 2022-08-16 ENCOUNTER — Encounter: Payer: Self-pay | Admitting: Internal Medicine

## 2022-09-18 ENCOUNTER — Ambulatory Visit (HOSPITAL_BASED_OUTPATIENT_CLINIC_OR_DEPARTMENT_OTHER): Payer: BC Managed Care – PPO | Admitting: Cardiovascular Disease

## 2022-09-18 ENCOUNTER — Encounter (HOSPITAL_BASED_OUTPATIENT_CLINIC_OR_DEPARTMENT_OTHER): Payer: Self-pay | Admitting: Cardiovascular Disease

## 2022-09-18 VITALS — BP 114/66 | HR 71 | Ht 64.0 in | Wt 161.8 lb

## 2022-09-18 DIAGNOSIS — I10 Essential (primary) hypertension: Secondary | ICD-10-CM | POA: Diagnosis not present

## 2022-09-18 DIAGNOSIS — E7800 Pure hypercholesterolemia, unspecified: Secondary | ICD-10-CM | POA: Diagnosis not present

## 2022-09-18 MED ORDER — TELMISARTAN 40 MG PO TABS: 40.0000 mg | ORAL_TABLET | Freq: Every day | ORAL | 3 refills | Status: DC

## 2022-09-18 NOTE — Progress Notes (Signed)
Cardiology Office Note:    Date:  09/18/2022   ID:  Holly Love, DOB 09-19-1960, MRN [id number]  PCP:  Armanda Heritage, NP  Cardiologist:  None    Referring MD: Lilian Coma., MD   No chief complaint on file.   History of Present Illness:    Holly Love is a 62 y.o. female with a hx of hypertension and thyroid disease here today for the evaluation of elevated C-reactive protein at the request of Armanda Heritage, NP. She last saw Armanda Heritage, NP 10/05/21. She received testing at an outside facility and her CRP came back elevated. She endorsed a family history of cardiovascular disease and requested a cardiology referral for possible cardiac involvement. She previously had a stress test in 2018 but the results are not available.   She has several family member with CAD but no premature disease. At her last visit she was exercising regularly and had no symptoms. She had a coronary calcium score of 0 on 12/2021. Recommended continuing Zetia.  Today, the patient states that she has been doing well lately. She initially went on blood pressure medication when she wasn't exercising and working a stressful job. She has recently been less stressed and been having a better diet and has been experiencing lower blood pressures.  She has lost some weight recently.  She is following up with a gastroenterologist regarding a cyst on her liver and hepatic steatosis.  She denies any palpitations, chest pain, shortness of breath, or peripheral edema. No lightheadedness, headaches, syncope, orthopnea, or PND.   Past Medical History:  Diagnosis Date   Thyroid disease    Previously on medications, now off    Past Surgical History:  Procedure Laterality Date   CESAREAN SECTION      Current Medications: Current Meds  Medication Sig   Cholecalciferol 25 MCG (1000 UT) tablet Take by mouth.   estradiol (ESTRACE) 0.5 MG tablet Take 0.5 mg by mouth daily.   ezetimibe (ZETIA) 10 MG tablet TAKE 1  TABLET(10 MG) BY MOUTH DAILY   fish oil-omega-3 fatty acids 1000 MG capsule Take 2 g by mouth daily.   levothyroxine (SYNTHROID, LEVOTHROID) 25 MCG tablet Take by mouth.   Magnesium Gluconate (MAGNESIUM 27 PO) Take 5 mg by mouth.   nortriptyline (PAMELOR) 10 MG capsule TAKE ONE CAPSULE BY MOUTH DAILY AT BEDTIME   progesterone (PROMETRIUM) 100 MG capsule TAKE 1 CAPSULE (100 MG TOTAL) BY MOUTH DAILY.   vitamin B-12 (CYANOCOBALAMIN) 100 MCG tablet Take 100 mcg by mouth daily.   [DISCONTINUED] telmisartan (MICARDIS) 80 MG tablet Take 80 mg by mouth daily.     Allergies:   Erythromycin, Chamomile flowers  [chamomile], Other, Penicillins, and Albumin (human)   Social History   Socioeconomic History   Marital status: Married    Spouse name: Not on file   Number of children: 2   Years of education: Not on file   Highest education level: Not on file  Occupational History   Occupation: Music therapist  Tobacco Use   Smoking status: Never   Smokeless tobacco: Never  Vaping Use   Vaping Use: Never used  Substance and Sexual Activity   Alcohol use: Yes    Alcohol/week: 1.0 - 2.0 standard drink of alcohol    Types: 1 - 2 Glasses of wine per week    Comment: Drinks wine about 5 glasses per week   Drug use: No   Sexual activity: Yes  Other Topics Concern   Not  on file  Social History Narrative   She works as a Music therapist   She lives with husband.  They have two children.   Lives in a two story home   Right Handed   Social Determinants of Health   Financial Resource Strain: Low Risk  (11/14/2021)   Overall Financial Resource Strain (CARDIA)    Difficulty of Paying Living Expenses: Not hard at all  Food Insecurity: No Food Insecurity (11/14/2021)   Hunger Vital Sign    Worried About Running Out of Food in the Last Year: Never true    Ran Out of Food in the Last Year: Never true  Transportation Needs: No Transportation Needs (11/14/2021)   PRAPARE - Radiographer, therapeutic (Medical): No    Lack of Transportation (Non-Medical): No  Physical Activity: Insufficiently Active (11/14/2021)   Exercise Vital Sign    Days of Exercise per Week: 2 days    Minutes of Exercise per Session: 30 min  Stress: Not on file  Social Connections: Not on file     Family History: The patient's family history includes Basal cell carcinoma in her father; Breast cancer (age of onset: 23) in her sister; Breast cancer (age of onset: 21) in her mother; Colon polyps in her father; Coronary artery disease in her father; Heart attack in her brother; Heart attack (age of onset: 77) in her paternal grandfather; Hyperlipidemia in her father; Kidney disease in her mother; Melanoma in her paternal uncle; Other in her maternal grandfather and son; Other (age of onset: 65) in an other family member; Stroke in her paternal grandmother; Stroke (age of onset: 59) in her maternal grandmother.  ROS:   Please see the history of present illness.     All other systems reviewed and negative.   EKGs/Labs/Other Studies Reviewed:    The following studies were reviewed today: No recent cardiovascular studies  EKG:  The EKG is personally reviewed 09/18/2022: Sinus rhythm, rate 71 bpm 11/14/21: Sinus rhythm, rate 77 bpm  Recent Labs: No results found for requested labs within last 365 days.   Recent Lipid Panel    Component Value Date/Time   CHOL 215 (H) 06/01/2014 1018   TRIG 133 06/01/2014 1018   HDL 50 06/01/2014 1018   CHOLHDL 4.3 06/01/2014 1018   VLDL 27 06/01/2014 1018   LDLCALC 138 (H) 06/01/2014 1018    Physical Exam:    VS:  BP 114/66 (BP Location: Left Arm, Patient Position: Sitting, Cuff Size: Normal)   Pulse 71   Ht '5\' 4"'$  (1.626 m)   Wt 161 lb 12.8 oz (73.4 kg)   LMP 06/03/2013   BMI 27.77 kg/m  , BMI Body mass index is 27.77 kg/m. GENERAL:  Well appearing HEENT: Pupils equal round and reactive, fundi not visualized, oral mucosa unremarkable NECK:  No jugular  venous distention, waveform within normal limits, carotid upstroke brisk and LUNGS:  Clear to auscultation bilaterally HEART:  RRR.  PMI not displaced or sustained,S1 and S2 within normal limits, no S3, no S4, no clicks, no rubs,  no murmurs ABD:  Flat, positive bowel sounds normal in frequency in pitch, no bruits, no rebound, no guarding, no midline pulsatile mass, no hepatomegaly, no splenomegaly EXT:  2 plus pulses throughout, no edema, no cyanosis no clubbing SKIN:  No rashes no nodules NEURO:  Cranial nerves II through XII grossly intact, motor grossly intact throughout PSYCH:  Cognitively intact, oriented to person place and time   ASSESSMENT:  1. Pure hypercholesterolemia   2. Essential hypertension, benign     PLAN:    Hyperlipidemia Lipids are stable on Zetia.  She is doing a good job with diet and exercise.  No changes recommended at this time.  Coronary calcium score was 0.  Recommend that she repeat her calcium score in 5-7 years.  Essential hypertension, benign Blood pressure is very well-controlled.  In fact it has been low.  She has been doing a great job of working on diet and exercise.  She is also losing weight.  Recommend reducing the telmisartan to 40 mg daily.  She will keep checking her blood pressures at home to ensure that it remains below 130/80.  She has follow-up with her PCP in a couple months.    Disposition: FU with Dondra Rhett C. Oval Linsey, MD, [facility] as needed.   Medication Adjustments/Labs and Tests Ordered: Current medicines are reviewed at length with the patient today.  Concerns regarding medicines are outlined above.  Orders Placed This Encounter  Procedures   EKG 12-Lead   Meds ordered this encounter  Medications   telmisartan (MICARDIS) 40 MG tablet    Sig: Take 1 tablet (40 mg total) by mouth daily.    Dispense:  90 tablet    Refill:  3    NEW DOSE, D/C 80 MG DAILY   I,Coren O'Brien,acting as a scribe for Skeet Latch, MD.,have  documented all relevant documentation on the behalf of Skeet Latch, MD,as directed by  Skeet Latch, MD while in the presence of Skeet Latch, MD.  I, Linwood Oval Linsey, MD have reviewed all documentation for this visit.  The documentation of the exam, diagnosis, procedures, and orders on 09/18/2022 are all accurate and complete.

## 2022-09-18 NOTE — Assessment & Plan Note (Addendum)
Lipids are stable on Zetia.  She is doing a good job with diet and exercise.  No changes recommended at this time.  Coronary calcium score was 0.  Recommend that she repeat her calcium score in 5-7 years.

## 2022-09-18 NOTE — Assessment & Plan Note (Signed)
Blood pressure is very well-controlled.  In fact it has been low.  She has been doing a great job of working on diet and exercise.  She is also losing weight.  Recommend reducing the telmisartan to 40 mg daily.  She will keep checking her blood pressures at home to ensure that it remains below 130/80.  She has follow-up with her PCP in a couple months.

## 2022-09-18 NOTE — Patient Instructions (Addendum)
Medication Instructions:  DECREASE YOUR TELMESARTAN TO 40 MG DAILY   Labwork: NONE  Testing/Procedures: NONE  Follow-Up: AS NEEDED   Any Other Special Instructions Will Be Listed Below (If Applicable).  Website to find a good blood pressure cuff: Validatebp.[email or web address]  MONITOR BLOOD PRESSURE DAILY AT HOME TO MAKE SURE IT IS CONSISTENTLY BELOW 130/80. TAKE YOUR READINGS TO FOLLOW UP WITH YOUR PRIMARY CARE

## 2022-09-19 ENCOUNTER — Encounter: Payer: Self-pay | Admitting: Internal Medicine

## 2022-09-19 ENCOUNTER — Ambulatory Visit: Payer: BC Managed Care – PPO | Admitting: Internal Medicine

## 2022-09-19 VITALS — BP 122/62 | HR 76 | Ht 64.0 in | Wt 162.0 lb

## 2022-09-19 DIAGNOSIS — K760 Fatty (change of) liver, not elsewhere classified: Secondary | ICD-10-CM | POA: Diagnosis not present

## 2022-09-19 DIAGNOSIS — Z23 Encounter for immunization: Secondary | ICD-10-CM | POA: Diagnosis not present

## 2022-09-19 DIAGNOSIS — K5900 Constipation, unspecified: Secondary | ICD-10-CM

## 2022-09-19 DIAGNOSIS — Z1211 Encounter for screening for malignant neoplasm of colon: Secondary | ICD-10-CM | POA: Diagnosis not present

## 2022-09-19 IMAGING — CT CT CARDIAC CORONARY ARTERY CALCIUM SCORE
3 series · 14 of 20 positions shown, 16 images · non-contrast
Comparison: None Available.
COMPARISON: None Available.

Addendum:
CLINICAL DATA: This over-read does not include interpretation of
cardiac or coronary anatomy or pathology. The coronary calcium score
interpretation by the cardiologist is attached.
CLINICAL DATA: Cardiovascular Disease Risk stratification

EXAM:
Coronary Calcium Score
TECHNIQUE: A gated, non-contrast computed tomography scan of the heart was
performed using 3mm slice thickness. Axial images were analyzed on a
dedicated workstation. Calcium scoring of the coronary arteries was
performed using the Agatston method.

[Series 2: cascseq 2.0 sa36 70% (id) · axial · 0.39mm/px · z∈[-233,-143]mm · 4 of 76 slices shown]
[im 16/76  vessel]
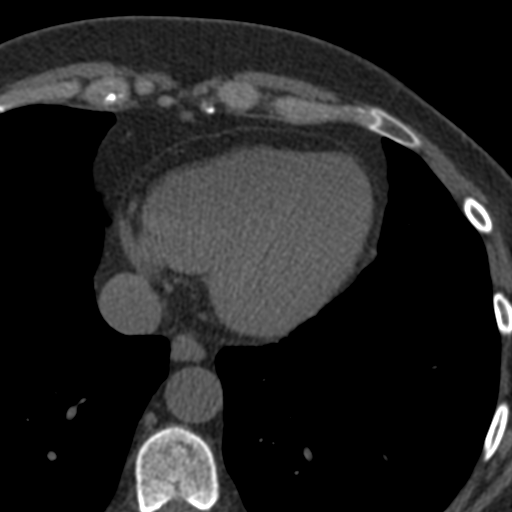
[im 31/76  vessel]
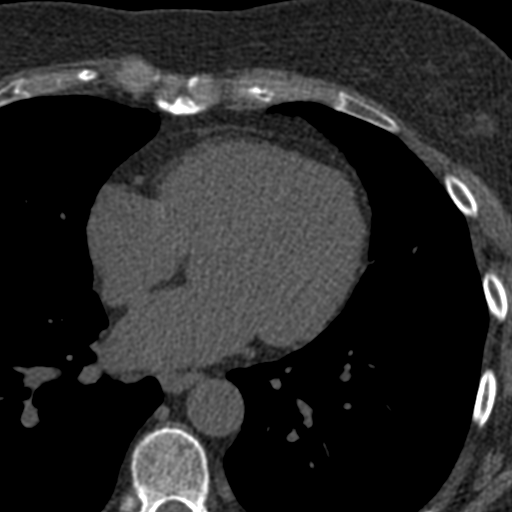
[im 46/76  vessel]
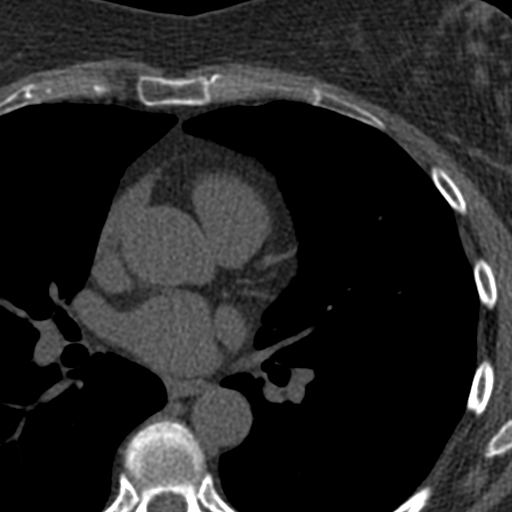
[im 61/76  vessel]
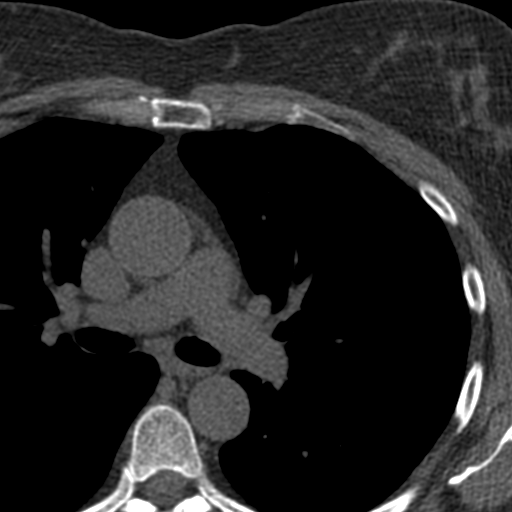

[Series 3: cascseq 2.0 bf37 st · axial · 0.66mm/px · z∈[-239,-139]mm · 5 of 76 slices shown, 7 images]
[im 13/76  vessel]
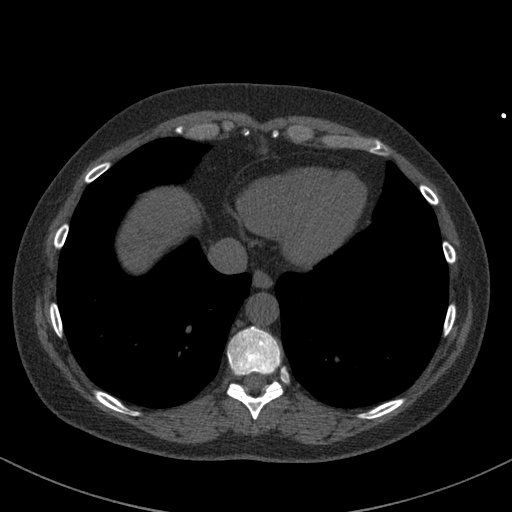
[im 13/76  lung]
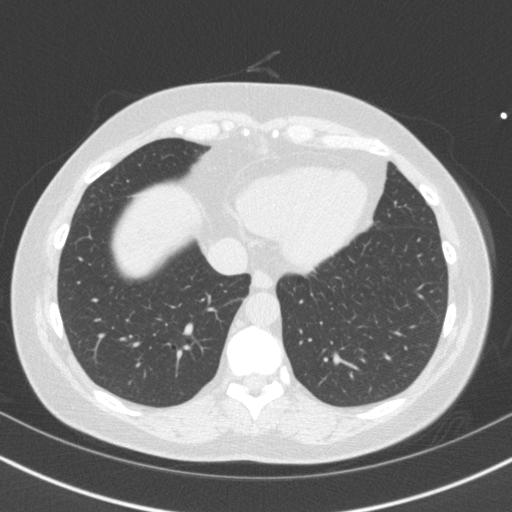
[im 26/76  vessel]
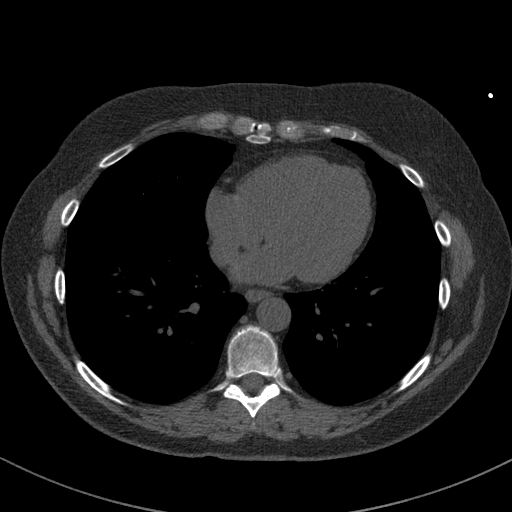
[im 38/76  vessel]
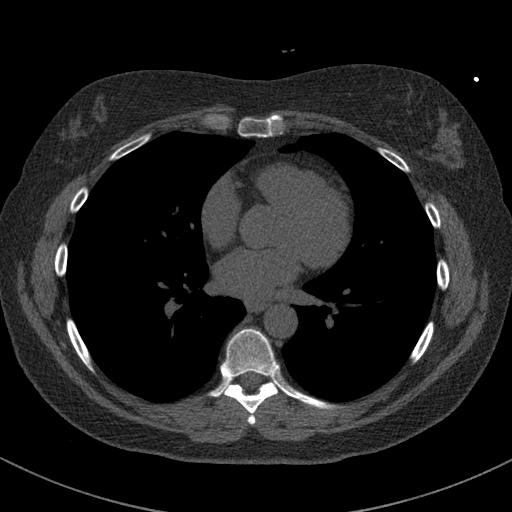
[im 51/76  vessel]
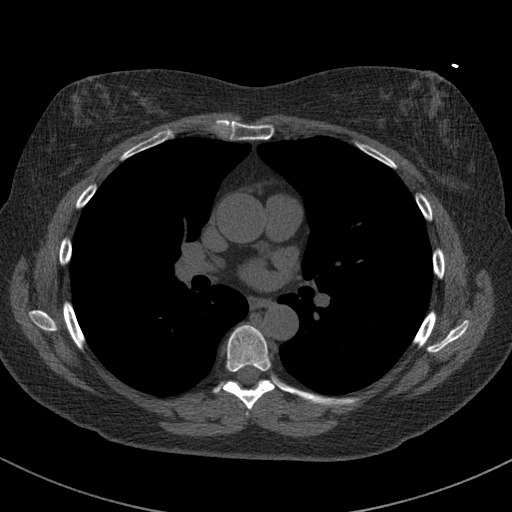
[im 63/76  vessel]
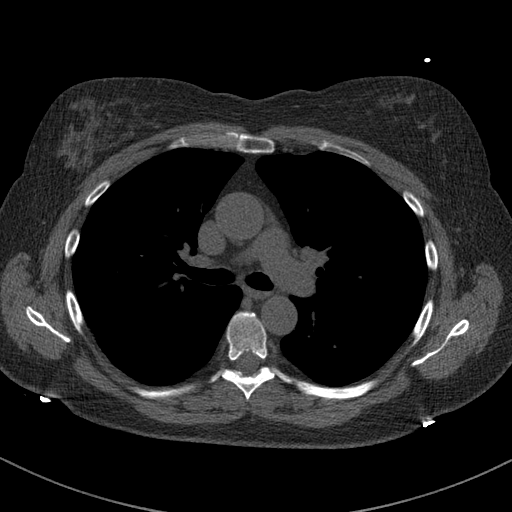
[im 63/76  lung]
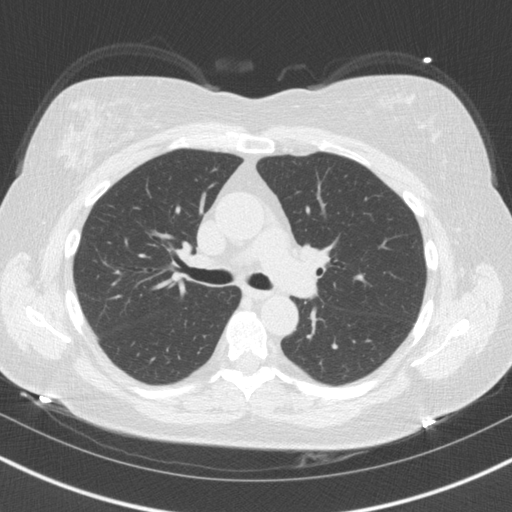

[Series 4: cascseq 2.0 br59 lung · axial · 0.66mm/px · z∈[-239,-139]mm · 5 of 76 slices shown]
[im 13/76  lung]
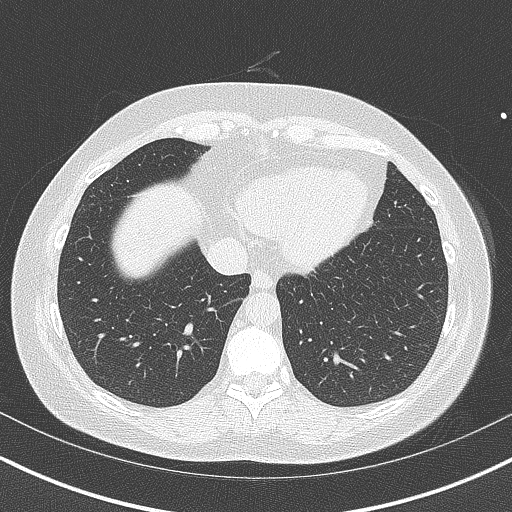
[im 26/76  lung]
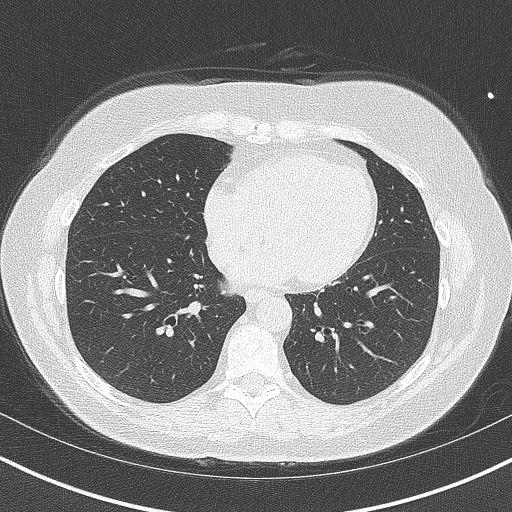
[im 38/76  lung]
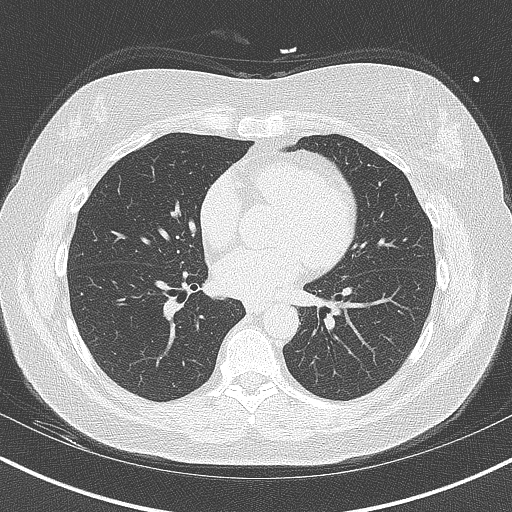
[im 51/76  lung]
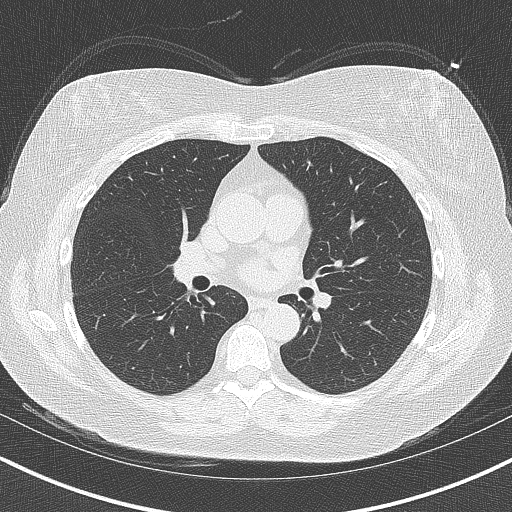
[im 63/76  lung]
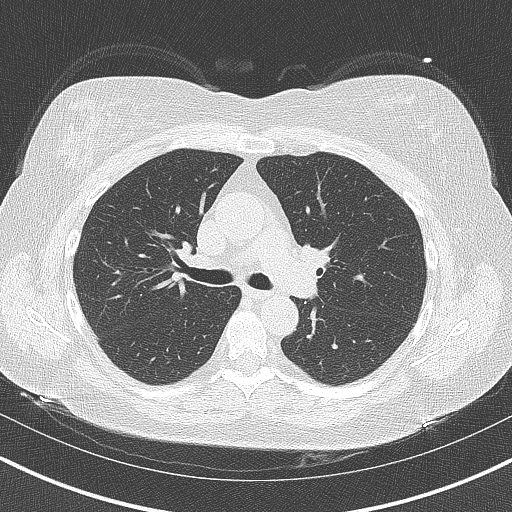

[14 of 20 positions shown; findings below may reference images not displayed]

FINDINGS: Vascular: No significant noncardiac vascular findings.

Mediastinum/Nodes: Visualized mediastinum and hilar regions
demonstrate no lymphadenopathy or masses.

Lungs/Pleura: Visualized lungs show no evidence of pulmonary edema,
consolidation, pneumothorax, nodule or pleural fluid.

Upper Abdomen: Visualized liver demonstrates evidence of steatosis.
There is a well-circumscribed cyst in the left lobe measuring 11 mm.

Musculoskeletal: Visualized bony structures are unremarkable.
IMPRESSION: Evidence of hepatic steatosis.
FINDINGS: Coronary arteries: Normal origins.

Coronary Calcium Score:

Total: 0

Pericardium: Normal.

Ascending Aorta: Normal caliber.

Non-cardiac: See separate report from [REDACTED].
IMPRESSION: Coronary calcium score of 0.



If CAC=0, it is reasonable to withhold statin therapy and reassess
in 5 to 10 years, as long as higher risk conditions are absent
(diabetes mellitus, family history of premature CHD in first degree
relatives (males <55 years; females <65 years), cigarette smoking,
or LDL >=190 mg/dL).

If CAC is 1 to 99, it is reasonable to initiate statin therapy for
patients >=55 years of age.

If CAC is >=100 or >=75th percentile, it is reasonable to initiate
statin therapy at any age.

Cardiology referral should be considered for patients with CAC
scores >=400 or >=75th percentile.

*7101 AHA/ACC/AACVPR/AAPA/ABC/GEMMI/BASHANDULI/GIORGI/[place]/EISHA/NEAZ/GENERVE
Guideline on the Management of Blood Cholesterol: A Report of the
American College of Cardiology/American Heart Association Task Force
on Clinical Practice Guidelines. J Am Coll Cardiol.
6845;73(24):1441-1598.

*** End of Addendum ***
FINDINGS: Vascular: No significant noncardiac vascular findings.

Mediastinum/Nodes: Visualized mediastinum and hilar regions
demonstrate no lymphadenopathy or masses.

Lungs/Pleura: Visualized lungs show no evidence of pulmonary edema,
consolidation, pneumothorax, nodule or pleural fluid.

Upper Abdomen: Visualized liver demonstrates evidence of steatosis.
There is a well-circumscribed cyst in the left lobe measuring 11 mm.

Musculoskeletal: Visualized bony structures are unremarkable.
IMPRESSION: Evidence of hepatic steatosis.

## 2022-09-19 MED ORDER — NA SULFATE-K SULFATE-MG SULF 17.5-3.13-1.6 GM/177ML PO SOLN: 1.0000 | ORAL | 0 refills | Status: DC

## 2022-09-19 NOTE — Patient Instructions (Addendum)
We have sent the following medications to your pharmacy for you to pick up at your convenience: [place] were given your Hep A and Hep B vaccines today. You will be due for your next Hep B vaccine on 10/22/22. You have been scheduled for Nurse Visit.   Your Hep A vaccine will be due in  August 2024. Office to contact you to schedule.   You have been scheduled for a colonoscopy. Please follow written instructions given to you at your visit today.  Please pick up your prep supplies at the pharmacy within the next 1-3 days. If you use inhalers (even only as needed), please bring them with you on the day of your procedure.  A high fiber diet with plenty of fluids (up to 8 glasses of water daily) is suggested to relieve these symptoms.  Metamucil, 1 tablespoon once or twice daily can be used to keep bowels regular if needed.  _______________________________________________________  If your blood pressure at your visit was 140/90 or greater, please contact your primary care physician to follow up on this.  _______________________________________________________  If you are age 49 or older, your body mass index should be between 23-30. Your Body mass index is 27.81 kg/m. If this is out of the aforementioned range listed, please consider follow up with your Primary Care Provider.  If you are age 57 or younger, your body mass index should be between 19-25. Your Body mass index is 27.81 kg/m. If this is out of the aformentioned range listed, please consider follow up with your Primary Care Provider.   ________________________________________________________  The [facility] providers would like to encourage you to use [facility] to communicate with providers for non-urgent requests or questions.  Due to long hold times on the telephone, sending your provider a message by [facility] may be a faster and more efficient way to get a response.  Please allow 48 business hours for a response.  Please remember  that this is for non-urgent requests.  _______________________________________________________  Thank you for choosing me and [facility].  Dr. Dayna Barker

## 2022-09-19 NOTE — Progress Notes (Signed)
Chief Complaint: Liver cyst, colon cancer screening  HPI : 62 year old female with history of hemorrhoids presents with liver cyst and colon cancer screening  She has lost some weight deliberately over the last month on the Whole 30 diet. She drinks on average 5 alcoholic beverages per week. Denies family history of liver disease. She will have reflux on occasion for which she takes omeprazole PRN. She will have constipation at times. She will get constipated if she eats more carbs. Endorses some scant amount of bleeding due to hemorrhoids when she gets constipated. Denies N&V, dysphagia, ab pain, hematochezia, or melena. Denies family history of colon cancer. Father has colon polyps and pancreatitis. Last colonoscopy was 5 years ago at [facility] that only showed internal hemorrhoids, and she was recommended for 5 year follow up.  Wt Readings from Last 3 Encounters:  09/19/22 162 lb (73.5 kg)  09/18/22 161 lb 12.8 oz (73.4 kg)  07/16/22 166 lb (75.3 kg)   Past Medical History:  Diagnosis Date   Anal fissure    Anemia    Cancer ([place])    Hypertension    Thyroid disease    Previously on medications, now off     Past Surgical History:  Procedure Laterality Date   CESAREAN SECTION     Family History  Problem Relation Age of Onset   Kidney disease Mother    Breast cancer Mother 34       s/p lump and radiation   Hyperlipidemia Father    Basal cell carcinoma Father        dx. unspecified age   Colon polyps Father        unspecified number   Coronary artery disease Father    Breast cancer Sister 70       s/p lump and radiation   Heart attack Brother    Melanoma Paternal Uncle        d. mid-70s   Stroke Maternal Grandmother 83   Other Maternal Grandfather        blood disorder with immune system issues - NOT leukemia; d. 51s   Stroke Paternal Grandmother    Heart attack Paternal Grandfather 93   Other Son        hx fatty cyst removed from under arm   Other Other 47        niece - hx biopsy and benign breast lumpectomy   Social History   Tobacco Use   Smoking status: Never   Smokeless tobacco: Never  Vaping Use   Vaping Use: Never used  Substance Use Topics   Alcohol use: Yes    Alcohol/week: 1.0 - 2.0 standard drink of alcohol    Types: 1 - 2 Glasses of wine per week    Comment: Drinks wine about 5 glasses per week   Drug use: No   Current Outpatient Medications  Medication Sig Dispense Refill   CALCIUM & MAGNESIUM CARBONATES PO Take by mouth.     Cholecalciferol 25 MCG (1000 UT) tablet Take by mouth.     estradiol (ESTRACE) 0.5 MG tablet Take 0.5 mg by mouth daily.     ezetimibe (ZETIA) 10 MG tablet TAKE 1 TABLET(10 MG) BY MOUTH DAILY     fish oil-omega-3 fatty acids 1000 MG capsule Take 2 g by mouth daily.     levothyroxine (SYNTHROID, LEVOTHROID) 25 MCG tablet Take by mouth.     Na Sulfate-K Sulfate-Mg Sulf (SUPREP BOWEL PREP KIT) 17.5-3.13-1.6 GM/177ML SOLN Take 1 kit  by mouth as directed. For colonoscopy prep 354 mL 0   nortriptyline (PAMELOR) 10 MG capsule TAKE ONE CAPSULE BY MOUTH DAILY AT BEDTIME 90 capsule 3   progesterone (PROMETRIUM) 100 MG capsule TAKE 1 CAPSULE (100 MG TOTAL) BY MOUTH DAILY. 30 capsule 4   telmisartan (MICARDIS) 40 MG tablet Take 1 tablet (40 mg total) by mouth daily. 90 tablet 3   vitamin B-12 (CYANOCOBALAMIN) 100 MCG tablet Take 100 mcg by mouth daily.     Magnesium Gluconate (MAGNESIUM 27 PO) Take 5 mg by mouth. (Patient not taking: Reported on 09/19/2022)     No current facility-administered medications for this visit.   Allergies  Allergen Reactions   Erythromycin Anaphylaxis   Chamomile Flowers  [Chamomile] Other (See Comments)   Other     Other reaction(s): Headache   Penicillins Hypertension   Albumin (Human) Other (See Comments)    Gastric pain     Review of Systems: All systems reviewed and negative except where noted in HPI.   Physical Exam: BP 122/62   Pulse 76   Ht '5\' 4"'$  (1.626 m)   Wt 162 lb  (73.5 kg)   LMP 06/03/2013   BMI 27.81 kg/m  Constitutional: Pleasant,well-developed, female in no acute distress. HEENT: Normocephalic and atraumatic. Conjunctivae are normal. No scleral icterus. Cardiovascular: Normal rate, regular rhythm.  Pulmonary/chest: Effort normal and breath sounds normal. No wheezing, rales or rhonchi. Abdominal: Soft, nondistended, nontender. Bowel sounds active throughout. There are no masses palpable. No hepatomegaly. Extremities: No edema Neurological: Alert and oriented to person place and time. Skin: Skin is warm and dry. No rashes noted. Psychiatric: Normal mood and affect. Behavior is normal.  Labs 09/2017: CMP unremarkable. TSH nml.   Labs 11/2021: ANA negative.  Labs 05/2022: CBC and CMP unremarkable. TSH nml.   CT coronary scoring 12/15/21: Evidence of hepatic steatosis. Well-circumscribed in the left lobe measuring 11 mm.  ASSESSMENT AND PLAN: Fatty liver Colon cancer screening Occasional constipation Patient presents with fatty liver and a benign liver cyst that were seen on a recent cardiac CT scan. I went over the progression of fatty liver disease with the patient. Luckily her liver tests have remained normal. Her NAFLD fibrosis score is 3.2 based upon her recent labs, suggesting F0-F2 fibrosis severity. Due to her fatty liver, will get her vaccinated against hepatitis A and B. She does have occasional constipation so I recommended that she start a daily fiber supplement. Patient is also due for colon cancer screening. Her last colonoscopy was 5 years ago that showed internal hemorrhoids and she was recommended for 5 year follow up. - Start daily fiber supplement - Continue coffee consumption - Limit alcohol use if possible - Will start on hepatitis A and B vaccination series - Colonoscopy [facility]  Christia Reading, MD  I spent 51 minutes of time, including in depth chart review, independent review of results as outlined above, communicating results  with the patient directly, face-to-face time with the patient, coordinating care, ordering studies and medications as appropriate, and documentation.

## 2022-10-22 ENCOUNTER — Ambulatory Visit: Payer: BC Managed Care – PPO

## 2022-10-29 ENCOUNTER — Ambulatory Visit (INDEPENDENT_AMBULATORY_CARE_PROVIDER_SITE_OTHER): Payer: BC Managed Care – PPO | Admitting: Internal Medicine

## 2022-10-29 DIAGNOSIS — K760 Fatty (change of) liver, not elsewhere classified: Secondary | ICD-10-CM

## 2022-10-29 DIAGNOSIS — Z23 Encounter for immunization: Secondary | ICD-10-CM

## 2022-10-29 NOTE — Progress Notes (Signed)
2nd dose of Heplisav given by Magdalene River

## 2022-11-07 ENCOUNTER — Other Ambulatory Visit: Payer: Self-pay | Admitting: Obstetrics and Gynecology

## 2022-11-07 ENCOUNTER — Encounter: Payer: Self-pay | Admitting: Internal Medicine

## 2022-11-07 DIAGNOSIS — R928 Other abnormal and inconclusive findings on diagnostic imaging of breast: Secondary | ICD-10-CM

## 2022-11-18 ENCOUNTER — Telehealth: Payer: Self-pay | Admitting: Nurse Practitioner

## 2022-11-18 NOTE — Telephone Encounter (Signed)
Patient called the weekend answering service 10:10 AM. I returned her call around 10:30AM. Patient reported she took two Ibuprofen on 4/5 and 4/6 and she wanted to make sure that was ok because she read her colonoscopy bowel prep instructions which advised no NSAIDs x 7 days prior to procedure. Patient did not take any Ibuprofen today.  I advised the patient to proceed with her bowel prep and proceed with her colonoscopy tomorrow as planned.

## 2022-11-19 ENCOUNTER — Ambulatory Visit (AMBULATORY_SURGERY_CENTER): Payer: BC Managed Care – PPO | Admitting: Internal Medicine

## 2022-11-19 ENCOUNTER — Encounter: Payer: Self-pay | Admitting: Internal Medicine

## 2022-11-19 ENCOUNTER — Other Ambulatory Visit: Payer: BC Managed Care – PPO

## 2022-11-19 VITALS — BP 131/72 | HR 67 | Temp 98.4°F | Resp 18 | Ht 64.0 in | Wt 162.0 lb

## 2022-11-19 DIAGNOSIS — R634 Abnormal weight loss: Secondary | ICD-10-CM

## 2022-11-19 DIAGNOSIS — K635 Polyp of colon: Secondary | ICD-10-CM

## 2022-11-19 DIAGNOSIS — Z1211 Encounter for screening for malignant neoplasm of colon: Secondary | ICD-10-CM | POA: Diagnosis present

## 2022-11-19 DIAGNOSIS — D123 Benign neoplasm of transverse colon: Secondary | ICD-10-CM

## 2022-11-19 DIAGNOSIS — D122 Benign neoplasm of ascending colon: Secondary | ICD-10-CM | POA: Diagnosis not present

## 2022-11-19 MED ORDER — SODIUM CHLORIDE 0.9 % IV SOLN
500.0000 mL | Freq: Once | INTRAVENOUS | Status: DC
Start: 2022-11-19 — End: 2022-11-19

## 2022-11-19 NOTE — Progress Notes (Deleted)
Pt's states no medical or surgical changes since previsit or office visit. 

## 2022-11-19 NOTE — Op Note (Addendum)
[facility] Patient Name: Holly Love Procedure Date: 11/19/2022 3:00 PM MRN: [id number] Endoscopist: Madelyn Brunner [place] , , [id number] Age: 62 Referring MD:  Date of Birth: 06/13/1961 Gender: Female Account #: [id number] Procedure:                Colonoscopy Indications:              Screening for colorectal malignant neoplasm Medicines:                Monitored Anesthesia Care Procedure:                Pre-Anesthesia Assessment:                           - Prior to the procedure, a History and Physical                            was performed, and patient medications and                            allergies were reviewed. The patient's tolerance of                            previous anesthesia was also reviewed. The risks                            and benefits of the procedure and the sedation                            options and risks were discussed with the patient.                            All questions were answered, and informed consent                            was obtained. Prior Anticoagulants: The patient has                            taken no anticoagulant or antiplatelet agents. ASA                            Grade Assessment: II - A patient with mild systemic                            disease. After reviewing the risks and benefits,                            the patient was deemed in satisfactory condition to                            undergo the procedure.                           After obtaining informed consent, the colonoscope  was passed under direct vision. Throughout the                            procedure, the patient's blood pressure, pulse, and                            oxygen saturations were monitored continuously. The                            Olympus [id number]-[id number]) Colonoscope was                            introduced through the anus and advanced to the the                            terminal  ileum. The colonoscopy was performed                            without difficulty. The patient tolerated the                            procedure well. The quality of the bowel                            preparation was excellent. The terminal ileum,                            ileocecal valve, appendiceal orifice, and rectum                            were photographed. Scope In: 3:06:34 PM Scope Out: 3:24:40 PM Scope Withdrawal Time: 0 hours 14 minutes 32 seconds  Total Procedure Duration: 0 hours 18 minutes 6 seconds  Findings:                 The terminal ileum appeared normal.                           Two sessile polyps were found in the transverse                            colon and ascending colon. The polyps were 5 to 8                            mm in size. These polyps were removed with a cold                            snare. Resection and retrieval were complete.                           Non-bleeding internal hemorrhoids were found during                            retroflexion. Complications:  No immediate complications. Estimated Blood Loss:     Estimated blood loss was minimal. Impression:               - The examined portion of the ileum was normal.                           - Two 5 to 8 mm polyps in the transverse colon and                            in the ascending colon, removed with a cold snare.                            Resected and retrieved.                           - Non-bleeding internal hemorrhoids. Recommendation:           - Discharge patient to home (with escort).                           - Await pathology results.                           - Will check TTG IgA and IgA since patient's 23 and                            Me genetic screening showed that she was high risk                            for celiac disease.                           - The findings and recommendations were discussed                            with the patient. Dr  Particia Lather "[name]" [name],  11/19/2022 3:31:45 PM

## 2022-11-19 NOTE — Progress Notes (Signed)
GASTROENTEROLOGY PROCEDURE H&P NOTE   Primary Care Physician: Filomena JunglingEdwards, Jerry, NP    Reason for Procedure:   Colon cancer screening  Plan:    Colonoscopy  Patient is appropriate for endoscopic procedure(s) in the ambulatory ([facility]) setting.  The nature of the procedure, as well as the risks, benefits, and alternatives were carefully and thoroughly reviewed with the patient. Ample time for discussion and questions allowed. The patient understood, was satisfied, and agreed to proceed.     HPI: Holly Love is a 62 y.o. female who presents for colonoscopy for evaluation of colon cancer screening .  Patient was most recently seen in the Gastroenterology Clinic on 09/19/22.  Patient had a 23 and me test that said she was at increase risk for celiac disease. She would like to be tested if possible. Please refer to that note for full details regarding GI history and clinical presentation.   Past Medical History:  Diagnosis Date   Anal fissure    Anemia    Cancer    Hypertension    Thyroid disease    Previously on medications, now off    Past Surgical History:  Procedure Laterality Date   CESAREAN SECTION      Prior to Admission medications   Medication Sig Start Date End Date Taking? Authorizing Provider  CALCIUM & MAGNESIUM CARBONATES PO Take by mouth.    [provider]  Cholecalciferol 25 MCG (1000 UT) tablet Take by mouth.    [provider]  estradiol (ESTRACE) 0.5 MG tablet Take 0.5 mg by mouth daily.    [provider]  ezetimibe (ZETIA) 10 MG tablet TAKE 1 TABLET(10 MG) BY MOUTH DAILY 04/22/18   [provider]  fish oil-omega-3 fatty acids 1000 MG capsule Take 2 g by mouth daily.    [provider]  levothyroxine (SYNTHROID, LEVOTHROID) 25 MCG tablet Take by mouth. 04/12/16   [provider]  Magnesium Gluconate (MAGNESIUM 27 PO) Take 5 mg by mouth. Patient not taking: Reported on 09/19/2022    [provider]  Na Sulfate-K Sulfate-Mg Sulf (SUPREP BOWEL PREP KIT) 17.5-3.13-1.6 GM/177ML SOLN Take 1 kit by mouth as directed. For colonoscopy prep 09/19/22   Imogene Burnorsey, Mykai Wendorf C, MD  nortriptyline (PAMELOR) 10 MG capsule TAKE ONE CAPSULE BY MOUTH DAILY AT BEDTIME 07/16/22   Patel, Donika K, DO  progesterone (PROMETRIUM) 100 MG capsule TAKE 1 CAPSULE (100 MG TOTAL) BY MOUTH DAILY. 06/15/13   Schoenhoff, Harrington Challengereborah D, MD  telmisartan (MICARDIS) 40 MG tablet Take 1 tablet (40 mg total) by mouth daily. 09/18/22   Chilton Siandolph, Tiffany, MD  vitamin B-12 (CYANOCOBALAMIN) 100 MCG tablet Take 100 mcg by mouth daily.    [provider]    Current Outpatient Medications  Medication Sig Dispense Refill   CALCIUM & MAGNESIUM CARBONATES PO Take by mouth.     Cholecalciferol 25 MCG (1000 UT) tablet Take by mouth.     estradiol (ESTRACE) 0.5 MG tablet Take 0.5 mg by mouth daily.     ezetimibe (ZETIA) 10 MG tablet TAKE 1 TABLET(10 MG) BY MOUTH DAILY     fish oil-omega-3 fatty acids 1000 MG capsule Take 2 g by mouth daily.     levothyroxine (SYNTHROID, LEVOTHROID) 25 MCG tablet Take by mouth.     Magnesium Gluconate (MAGNESIUM 27 PO) Take 5 mg by mouth. (Patient not taking: Reported on 09/19/2022)     Na Sulfate-K Sulfate-Mg Sulf (SUPREP BOWEL PREP KIT) 17.5-3.13-1.6 GM/177ML SOLN Take 1 kit  by mouth as directed. For colonoscopy prep 354 mL 0   nortriptyline (PAMELOR) 10 MG capsule TAKE ONE CAPSULE BY MOUTH DAILY AT BEDTIME 90 capsule 3   progesterone (PROMETRIUM) 100 MG capsule TAKE 1 CAPSULE (100 MG TOTAL) BY MOUTH DAILY. 30 capsule 4   telmisartan (MICARDIS) 40 MG tablet Take 1 tablet (40 mg total) by mouth daily. 90 tablet 3   vitamin B-12 (CYANOCOBALAMIN) 100 MCG tablet Take 100 mcg by mouth daily.     Current Facility-Administered Medications  Medication Dose Route Frequency Provider Last Rate Last Admin   0.9 %  sodium chloride infusion  500 mL Intravenous Once Imogene Burn, MD        Allergies as of 11/19/2022 -  Review Complete 11/19/2022  Allergen Reaction Noted   Erythromycin Anaphylaxis 11/01/2011   Chamomile flowers  [chamomile] Other (See Comments) 10/21/2019   Other Hives 10/21/2019   Penicillins Hypertension 07/17/2012   Albumin (human) Other (See Comments) 02/17/2016    Family History  Problem Relation Age of Onset   Kidney disease Mother    Breast cancer Mother 65       s/p lump and radiation   Hyperlipidemia Father    Basal cell carcinoma Father        dx. unspecified age   Colon polyps Father        unspecified number   Coronary artery disease Father    Breast cancer Sister 73       s/p lump and radiation   Heart attack Brother    Melanoma Paternal Uncle        d. mid-70s   Stroke Maternal Grandmother 29   Other Maternal Grandfather        blood disorder with immune system issues - NOT leukemia; d. 70s   Stroke Paternal Grandmother    Heart attack Paternal Grandfather 75   Other Son        hx fatty cyst removed from under arm   Other Other 40       niece - hx biopsy and benign breast lumpectomy    Social History   Socioeconomic History   Marital status: Married    Spouse name: Not on file   Number of children: 2   Years of education: Not on file   Highest education level: Not on file  Occupational History   Occupation: Firefighter   Occupation: Music therapist  Tobacco Use   Smoking status: Never   Smokeless tobacco: Never  Vaping Use   Vaping Use: Never used  Substance and Sexual Activity   Alcohol use: Yes    Alcohol/week: 1.0 - 2.0 standard drink of alcohol    Types: 1 - 2 Glasses of wine per week    Comment: Drinks wine about 5 glasses per week   Drug use: No   Sexual activity: Yes  Other Topics Concern   Not on file  Social History Narrative   She works as a Firefighter   She lives with husband.  They have two children.   Lives in a two story home   Right Handed   Social Determinants of Health   Financial Resource Strain:  Low Risk  (11/14/2021)   Overall Financial Resource Strain (CARDIA)    Difficulty of Paying Living Expenses: Not hard at all  Food Insecurity: No Food Insecurity (11/14/2021)   Hunger Vital Sign    Worried About Running Out of Food in the Last Year: Never true    Ran  Out of Food in the Last Year: Never true  Transportation Needs: No Transportation Needs (11/14/2021)   PRAPARE - Administrator, Civil Service (Medical): No    Lack of Transportation (Non-Medical): No  Physical Activity: Insufficiently Active (11/14/2021)   Exercise Vital Sign    Days of Exercise per Week: 2 days    Minutes of Exercise per Session: 30 min  Stress: Not on file  Social Connections: Not on file  Intimate Partner Violence: Not on file    Physical Exam: Vital signs in last 24 hours: BP 115/60   Pulse 74   Temp 98.4 F (36.9 C) (Temporal)   Ht 5\' 4"  (1.626 m)   Wt 162 lb (73.5 kg)   LMP 06/03/2013   SpO2 98%   BMI 27.81 kg/m  GEN: NAD EYE: Sclerae anicteric ENT: MMM CV: Non-tachycardic Pulm: No increased WOB GI: Soft NEURO:  Alert & Oriented   Eulah Pont, MD [facility]   11/19/2022 1:56 PM

## 2022-11-19 NOTE — Progress Notes (Signed)
Called to room to assist during endoscopic procedure.  Patient ID and intended procedure confirmed with present staff. Received instructions for my participation in the procedure from the performing physician.  

## 2022-11-19 NOTE — Patient Instructions (Signed)
   Handouts on polyps & hemorrhoids given to you today   Await pathology results on polyps removed    Dr [name] ordered lab work  to be done today prior to discharge    YOU HAD AN ENDOSCOPIC PROCEDURE TODAY AT THE [facility]:   Refer to the procedure report that was given to you for any specific questions about what was found during the examination.  If the procedure report does not answer your questions, please call your gastroenterologist to clarify.  If you requested that your care partner not be given the details of your procedure findings, then the procedure report has been included in a sealed envelope for you to review at your convenience later.  YOU SHOULD EXPECT: Some feelings of bloating in the abdomen. Passage of more gas than usual.  Walking can help get rid of the air that was put into your GI tract during the procedure and reduce the bloating. If you had a lower endoscopy (such as a colonoscopy or flexible sigmoidoscopy) you may notice spotting of blood in your stool or on the toilet paper. If you underwent a bowel prep for your procedure, you may not have a normal bowel movement for a few days.  Please Note:  You might notice some irritation and congestion in your nose or some drainage.  This is from the oxygen used during your procedure.  There is no need for concern and it should clear up in a day or so.  SYMPTOMS TO REPORT IMMEDIATELY:  Following lower endoscopy (colonoscopy or flexible sigmoidoscopy):  Excessive amounts of blood in the stool  Significant tenderness or worsening of abdominal pains  Swelling of the abdomen that is new, acute  Fever of 100F or higher   For urgent or emergent issues, a gastroenterologist can be reached at any hour by calling [phone]. Do not use MyChart messaging for urgent concerns.    DIET:  We do recommend a small meal at first, but then you may proceed to your regular diet.  Drink plenty of fluids but you should  avoid alcoholic beverages for 24 hours.  ACTIVITY:  You should plan to take it easy for the rest of today and you should NOT DRIVE or use heavy machinery until tomorrow (because of the sedation medicines used during the test).    FOLLOW UP: Our staff will call the number listed on your records the next business day following your procedure.  We will call around 7:15- 8:00 am to check on you and address any questions or concerns that you may have regarding the information given to you following your procedure. If we do not reach you, we will leave a message.     If any biopsies were taken you will be contacted by phone or by letter within the next 1-3 weeks.  Please call us at [phone] if you have not heard about the biopsies in 3 weeks.    SIGNATURES/CONFIDENTIALITY: You and/or your care partner have signed paperwork which will be entered into your electronic medical record.  These signatures attest to the fact that that the information above on your After Visit Summary has been reviewed and is understood.  Full responsibility of the confidentiality of this discharge information lies with you and/or your care-partner.

## 2022-11-19 NOTE — Progress Notes (Signed)
Uneventful anesthetic. Report to pacu rn. Vss. Care resumed by rn. 

## 2022-11-20 ENCOUNTER — Telehealth: Payer: Self-pay | Admitting: *Deleted

## 2022-11-20 LAB — IGA: Immunoglobulin A: 117 mg/dL (ref 70–320)

## 2022-11-20 LAB — TISSUE TRANSGLUTAMINASE, IGA: (tTG) Ab, IgA: <1 U/mL

## 2022-11-20 NOTE — Telephone Encounter (Signed)
  Follow up Call-     11/19/2022    1:47 PM  Call back number  Post procedure Call Back phone  # [phone]  Permission to leave phone message Yes     Patient questions:  Do you have a fever, pain , or abdominal swelling? No. Pain Score  0 *  Have you tolerated food without any problems? Yes.    Have you been able to return to your normal activities? Yes.    Do you have any questions about your discharge instructions: Diet   No. Medications  No. Follow up visit  No.  Do you have questions or concerns about your Care? No.  Actions: * If pain score is 4 or above: No action needed, pain <4.

## 2022-11-21 ENCOUNTER — Ambulatory Visit: Payer: BC Managed Care – PPO

## 2022-11-21 ENCOUNTER — Ambulatory Visit
Admission: RE | Admit: 2022-11-21 | Discharge: 2022-11-21 | Disposition: A | Discharge status: Home / Self Care | Payer: BC Managed Care – PPO | Source: Ambulatory Visit | Attending: Obstetrics and Gynecology | Admitting: Obstetrics and Gynecology

## 2022-11-21 DIAGNOSIS — R928 Other abnormal and inconclusive findings on diagnostic imaging of breast: Secondary | ICD-10-CM

## 2022-11-22 ENCOUNTER — Encounter: Payer: Self-pay | Admitting: Internal Medicine

## 2022-11-28 ENCOUNTER — Other Ambulatory Visit: Payer: Self-pay | Admitting: Obstetrics and Gynecology

## 2022-11-28 DIAGNOSIS — Z803 Family history of malignant neoplasm of breast: Secondary | ICD-10-CM

## 2023-07-08 ENCOUNTER — Ambulatory Visit
Admission: RE | Admit: 2023-07-08 | Discharge: 2023-07-08 | Disposition: A | Discharge status: Home / Self Care | Payer: BC Managed Care – PPO | Source: Ambulatory Visit | Attending: Obstetrics and Gynecology | Admitting: Obstetrics and Gynecology

## 2023-07-08 DIAGNOSIS — Z803 Family history of malignant neoplasm of breast: Secondary | ICD-10-CM

## 2023-07-08 MED ORDER — GADOPICLENOL 0.5 MMOL/ML IV SOLN
7.0000 mL | Freq: Once | INTRAVENOUS | Status: AC | PRN
  Administered 2023-07-08: 7 mL via INTRAVENOUS

## 2023-07-22 ENCOUNTER — Encounter: Payer: Self-pay | Admitting: Neurology

## 2023-07-22 ENCOUNTER — Ambulatory Visit: Payer: BC Managed Care – PPO | Admitting: Neurology

## 2023-07-22 VITALS — BP 137/79 | HR 85 | Ht 64.0 in | Wt 157.0 lb

## 2023-07-22 DIAGNOSIS — M5417 Radiculopathy, lumbosacral region: Secondary | ICD-10-CM

## 2023-07-22 MED ORDER — NORTRIPTYLINE HCL 10 MG PO CAPS: 20.0000 mg | ORAL_CAPSULE | Freq: Every day | ORAL | 3 refills | Status: AC

## 2023-07-22 NOTE — Progress Notes (Signed)
Follow-up Visit   Date: 07/22/23   Holly Love MRN: [id number] DOB: Sep 03, 1960   Interim History: Holly Love is a 62 y.o. right-handed Caucasian female returning to the clinic for follow-up of left sciatica.  The patient was accompanied to the clinic by self.   IMPRESSION/PLAN: Low back pain with radicular pain into the right leg.  MRI lumbar spine has been ordered by [facility].  In the past, she has left sciatica which was being managed with nortriptyline 10mg  at bedtime. Given her recent worsening pain, I will increase notriptyline to 20mg  daily.  Previously tried: Lyrica (lightheadedness), neurontin, nortriptyline (constipation), Cymbalta (cognitive changes).    Regarding her diplopia, she describes this as binocular.  Symptoms are not suggestive of myasthenia gravis.   She will be transferring care to [facility], but is welcome to follow-up here as needed.  -------------------------------------------------------------------------------- History of present illness: In May 2013, she fell and since then she had developed slowly progressive low back pain. In April 2014, she developed shooting pain starting from her buttocks and radiating into her legs, worse on the right. Symptoms are better when she is standing, walking, or laying, but prolonged sitting and standing exacerbates her symptoms. She has associated tingling of the legs. Therapy helped for a brief time and resolved her shooting pain, but she continues to have prickly sensation from her buttocks to her posterior thigh down to her knees. MRI of the lumbar spine showed grade I anterolisthesis at L4-5 with moderate facet hypertrophy without nerve impingement. She had tried heat, ice, and muscle relaxants. Of note, she had seen Dr. Sandria Manly in 2006 for chronic daily headaches.   UPDATE 07/25/2020: She is here for follow-up visit.  Overall, her back pain has been doing quite well.  Recently, she was cleaning her son's  bathrub which aggravated her low back.  She has taken extra nortriptyline, as needed when it is severe.  She is also working out with a trainer twice per week.  She complaints of spells of sparkling lights in her vision, lasting 15-20 minutes.  It occurs once every 2-3 months.  She sometimes has a headache.   She continues to have ongoing issues with low grade dizziness and imbalance.  She does not fall.  MRI brain ww contrast in 2016 was normal.  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   UPDATE 07/16/2022:  She is here for 1 year follow-up.  She is concerned about her stroke risk because grandmothers had stroke. She is on medication for high blood pressure and cholesterol.  No history of diabetes and she does not smoke. She has ocular migraines which last 30-minutes and occurs about twice per year.  She has associated mild headache with resolves with NSAIDs.  Her left leg pain is well-controlled on nortriptyline and she is due for a refill.  UPDATE 07/21/2022:  She is here for 1 year visit.  She moved to [place] in November.  She feels that during this move, she may have lifted something too heavy and it has aggravated her low back pain, which is radiating down her legs.  She went to [facility] who has ordered MRI lumbar spine.  She is taking muscle relaxer and meloxicam.  She will be starting PT.  For the past few months, she also has constant double vision with images on top each other, even with both eyes closed.  No ptosis or weakness.  Symptoms  do not fluctuate or improve with one eye closure.   Medications:  Current Outpatient Medications on File Prior to Visit  Medication Sig Dispense Refill   chlorzoxazone (PARAFON) 500 MG tablet SMARTSIG:1.0 Tablet(s) By Mouth 3 Times Daily     Cholecalciferol 25 MCG (1000 UT) tablet Take by mouth.     estradiol (ESTRACE) 0.5 MG tablet Take 0.5 mg by mouth daily.     ezetimibe  (ZETIA) 10 MG tablet TAKE 1 TABLET(10 MG) BY MOUTH DAILY     fish oil-omega-3 fatty acids 1000 MG capsule Take 2 g by mouth daily.     fluticasone (FLONASE) 50 MCG/ACT nasal spray Place 2 sprays into the nose daily as needed.     levothyroxine (SYNTHROID, LEVOTHROID) 25 MCG tablet Take by mouth.     meloxicam (MOBIC) 15 MG tablet SMARTSIG:1.0 Tablet(s) By Mouth Daily     Multiple Vitamin (MULTIVITAMIN WITH MINERALS) TABS tablet Take 1 tablet by mouth daily.     NON FORMULARY Take 1 tablet by mouth at bedtime. 5-HTP- OTC mood med     omeprazole (PRILOSEC) 40 MG capsule Take 40 mg by mouth daily as needed.     progesterone (PROMETRIUM) 100 MG capsule TAKE 1 CAPSULE (100 MG TOTAL) BY MOUTH DAILY. 30 capsule 4   telmisartan (MICARDIS) 80 MG tablet Take 40 mg by mouth daily.     vitamin B-12 (CYANOCOBALAMIN) 100 MCG tablet Take 100 mcg by mouth daily.     CALCIUM & MAGNESIUM CARBONATES PO Take by mouth. (Patient not taking: Reported on 07/22/2023)     No current facility-administered medications on file prior to visit.    Allergies:  Allergies  Allergen Reactions   Erythromycin Anaphylaxis   Chamomile Flowers  [Chamomile] Other (See Comments)   Other Hives    Other reaction(s): Headache   Penicillins Hypertension   Albumin (Human) Other (See Comments)    Gastric pain    Vital Signs:  BP 137/79   Pulse 85   Ht 5\' 4"  (1.626 m)   Wt 157 lb (71.2 kg)   LMP 06/03/2013   SpO2 99%   BMI 26.95 kg/m   Neurological Exam: MENTAL STATUS including orientation to time, place, person, recent and remote memory, attention span and concentration, language, and fund of knowledge is normal.  Speech is not dysarthric.  CRANIAL NERVES:   Pupils equal round and reactive to light.  Normal conjugate, extra-ocular eye movements in all directions of gaze.  No ptosis.  Face is symmetric.   MOTOR:  Motor strength is 5/5 in all extremities.  No pronator drift.      MSRs:  Reflexes are 2+/4 throughout,  except right patella jerks is reduced 1+/4.  SENSORY:  Intact to vibration throughout.  COORDINATION/GAIT:  Normal finger-to- nose-finger.  Intact rapid alternating movements bilaterally.  Gait narrow based and stable. Stressed gait intact.   Data: n/a    Thank you for allowing me to participate in patient's care.  If I can answer any additional questions, I would be pleased to do so.    Sincerely,    Voncille Simm K. Allena Katz, DO
